# Patient Record
Sex: Female | Born: 1990 | Hispanic: No | Marital: Married | Smoking: Never smoker
Health system: Southern US, Community
[De-identification: ages and names within clinical notes are randomized; demographics above are authoritative.]

## PROBLEM LIST (undated history)

## (undated) ENCOUNTER — Emergency Department (HOSPITAL_COMMUNITY)

## (undated) DIAGNOSIS — Z0289 Encounter for other administrative examinations: Secondary | ICD-10-CM

## (undated) DIAGNOSIS — E663 Overweight: Secondary | ICD-10-CM

## (undated) HISTORY — DX: Encounter for other administrative examinations: Z02.89

## (undated) HISTORY — DX: Overweight: E66.3

---

## 2020-03-18 NOTE — Congregational Nurse Program (Signed)
  Dept: [phone]   Congregational Nurse Program Note  Date of Encounter: 03/18/2020  Past Medical History: No past medical history on file.  Encounter Details:   New arrival form [place] c/o right shoulder pain pain and abdominal pain.She reports a diagnosis of H.pylori in [place].She would also like to establish care with PCP. Appointment scheduled with [facility] Internal medicine for December 20th @2 :45PM  RN BSN PCCN  [facility] [phone]-office [phone]

## 2020-03-19 ENCOUNTER — Ambulatory Visit (HOSPITAL_COMMUNITY)
Admission: EM | Admit: 2020-03-19 | Discharge: 2020-03-19 | Disposition: A | Discharge status: Home / Self Care | Payer: Medicaid Other | Attending: Internal Medicine | Admitting: Internal Medicine

## 2020-03-19 ENCOUNTER — Encounter (HOSPITAL_COMMUNITY): Payer: Self-pay

## 2020-03-19 DIAGNOSIS — R350 Frequency of micturition: Secondary | ICD-10-CM

## 2020-03-19 DIAGNOSIS — R1030 Lower abdominal pain, unspecified: Secondary | ICD-10-CM

## 2020-03-19 DIAGNOSIS — K2900 Acute gastritis without bleeding: Secondary | ICD-10-CM | POA: Diagnosis not present

## 2020-03-19 DIAGNOSIS — R110 Nausea: Secondary | ICD-10-CM

## 2020-03-19 DIAGNOSIS — R3915 Urgency of urination: Secondary | ICD-10-CM | POA: Diagnosis not present

## 2020-03-19 DIAGNOSIS — M549 Dorsalgia, unspecified: Secondary | ICD-10-CM

## 2020-03-19 LAB — POCT URINALYSIS DIPSTICK, ED / UC
Bilirubin Urine: NEGATIVE
Glucose, UA: NEGATIVE mg/dL
Hgb urine dipstick: NEGATIVE
Ketones, ur: NEGATIVE mg/dL
Nitrite: NEGATIVE
Protein, ur: NEGATIVE mg/dL
Specific Gravity, Urine: 1.015 (ref 1.005–1.030)
Urobilinogen, UA: 0.2 mg/dL (ref 0.0–1.0)
pH: 5.5 (ref 5.0–8.0)

## 2020-03-19 MED ORDER — ACETAMINOPHEN 500 MG PO TABS: 500.0000 mg | ORAL_TABLET | Freq: Four times a day (QID) | ORAL | 0 refills | Status: DC | PRN

## 2020-03-19 MED ORDER — TIZANIDINE HCL 4 MG PO TABS: 4.0000 mg | ORAL_TABLET | Freq: Every day | ORAL | 0 refills | Status: DC

## 2020-03-19 MED ORDER — PANTOPRAZOLE SODIUM 20 MG PO TBEC: 20.0000 mg | DELAYED_RELEASE_TABLET | Freq: Every day | ORAL | 1 refills | Status: DC

## 2020-03-19 NOTE — ED Triage Notes (Signed)
Pt presents with lower back pain and abdominal pain X  5 days. Pt states she is experiencing dysuria, frequency, urgency X 2 days. Pt states she has been feeling nauseous.

## 2020-03-19 NOTE — ED Triage Notes (Signed)
Pt states she is having shoulder pain and stomach pain.

## 2020-03-20 NOTE — ED Provider Notes (Signed)
[facility]    CSN: [id number] Arrival date & time: 03/19/20  1221      History   Chief Complaint Chief Complaint  Patient presents with  . Abdominal Pain  . Back Pain    Mid and lower  . Nausea  . Urinary Urgency  . Urinary Frequency  . Shoulder Pain    HPI Katrina Brewer is a 29 y.o. female comes to the urgent care with complaints of mid to upper back pain of 5 days duration.  Symptoms started 5 days ago and has been persistent.  Patient describes the pain as throbbing.  She has tried ibuprofen for the pain.  Pain is aggravated by movement.  No heavy lifting or falls.  No trauma. No known relieving factors.  Patient also complains of epigastric abdominal pain.  Pain is worse when she eats.  No vomiting but she has had some nausea sensation.  Patient relocated here to the [place] from [place].Marland Kitchen   HPI  History reviewed. No pertinent past medical history.  There are no problems to display for this patient.   History reviewed. No pertinent surgical history.  OB History   No obstetric history on file.      Home Medications    Prior to Admission medications   Medication Sig Start Date End Date Taking? Authorizing Provider  acetaminophen (TYLENOL) 500 MG tablet Take 1 tablet (500 mg total) by mouth every 6 (six) hours as needed. 03/19/20   Merrilee Jansky, MD  pantoprazole (PROTONIX) 20 MG tablet Take 1 tablet (20 mg total) by mouth daily. 03/19/20   Obinna Ehresman, Britta Mccreedy, MD  tiZANidine (ZANAFLEX) 4 MG tablet Take 1 tablet (4 mg total) by mouth at bedtime. 03/19/20   Sebastyan Snodgrass, Britta Mccreedy, MD    Family History History reviewed. No pertinent family history.  Social History Social History   Tobacco Use  . Smoking status: Never Smoker  . Smokeless tobacco: Never Used     Allergies   Patient has no known allergies.   Review of Systems Review of Systems  Constitutional: Negative for fatigue.  HENT: Negative.   Gastrointestinal: Positive for  abdominal pain and nausea. Negative for diarrhea and vomiting.  Genitourinary: Positive for urgency. Negative for difficulty urinating, dysuria and frequency.  Musculoskeletal: Positive for back pain and myalgias.  Neurological: Negative for dizziness, numbness and headaches.     Physical Exam Triage Vital Signs ED Triage Vitals  Enc Vitals Group     BP 03/19/20 1407 102/68     Pulse Rate 03/19/20 1407 68     Resp 03/19/20 1407 18     Temp 03/19/20 1407 98 F (36.7 C)     Temp Source 03/19/20 1407 Oral     SpO2 03/19/20 1407 100 %     Weight --      Height --      Head Circumference --      Peak Flow --      Pain Score 03/19/20 1429 7     Pain Loc --      Pain Edu? --      Excl. in GC? --    No data found.  Updated Vital Signs BP 102/68 (BP Location: Left Arm)   Pulse 68   Temp 98 F (36.7 C) (Oral)   Resp 18   LMP 03/01/2020 (Exact Date)   SpO2 100%   Visual Acuity Right Eye Distance:   Left Eye Distance:   Bilateral Distance:  Right Eye Near:   Left Eye Near:    Bilateral Near:     Physical Exam Vitals reviewed.  Constitutional:      General: She is not in acute distress.    Appearance: She is well-developed. She is not ill-appearing, toxic-appearing or diaphoretic.  Cardiovascular:     Rate and Rhythm: Normal rate and regular rhythm.     Heart sounds: Normal heart sounds.  Pulmonary:     Effort: Pulmonary effort is normal.     Breath sounds: Normal breath sounds. No stridor. No wheezing or rhonchi.  Abdominal:     General: Abdomen is flat. There is no distension or abdominal bruit.     Palpations: Abdomen is soft. There is no shifting dullness, hepatomegaly or splenomegaly.     Tenderness: There is no abdominal tenderness.     Hernia: No hernia is present.  Neurological:     Mental Status: She is alert.      UC Treatments / Results  Labs (all labs ordered are listed, but only abnormal results are displayed) Labs Reviewed  POCT URINALYSIS  DIPSTICK, ED / UC - Abnormal; Notable for the following components:      Result Value   Leukocytes,Ua TRACE (*)    All other components within normal limits    EKG   Radiology No results found.  Procedures Procedures (including critical care time)  Medications Ordered in UC Medications - No data to display  Initial Impression / Assessment and Plan / UC Course  I have reviewed the triage vital signs and the nursing notes.  Pertinent labs & imaging results that were available during my care of the patient were reviewed by me and considered in my medical decision making (see chart for details).     1.  Musculoskeletal back pain: Tylenol extra strength as needed for pain Gentle range of motion exercises Zanaflex as needed for muscle spasms  2.  Epigastric abdominal pain likely acute gastritis-NSAID induced: Avoid NSAID use Use Tylenol as needed for pain Protonix 20 mg orally daily If patient symptoms are persistent she would need to be evaluated for H. pylori. Return precautions given Consultation was done using an interpreter Final Clinical Impressions(s) / UC Diagnoses   Final diagnoses:  Acute superficial gastritis without hemorrhage  Musculoskeletal back pain   Discharge Instructions   None    ED Prescriptions    Medication Sig Dispense Auth. Provider   pantoprazole (PROTONIX) 20 MG tablet Take 1 tablet (20 mg total) by mouth daily. 90 tablet Leasha Goldberger, Britta Mccreedy, MD   tiZANidine (ZANAFLEX) 4 MG tablet Take 1 tablet (4 mg total) by mouth at bedtime. 30 tablet Batoul Limes, Britta Mccreedy, MD   acetaminophen (TYLENOL) 500 MG tablet Take 1 tablet (500 mg total) by mouth every 6 (six) hours as needed. 30 tablet Prentice Sackrider, Britta Mccreedy, MD     PDMP not reviewed this encounter.   Merrilee Jansky, MD 03/20/20 [phone]

## 2020-03-24 ENCOUNTER — Encounter: Payer: Self-pay | Admitting: Internal Medicine

## 2020-04-29 NOTE — Congregational Nurse Program (Signed)
  Dept: [phone]   Congregational Nurse Program Note  Date of Encounter: 04/29/2020  Past Medical History: No past medical history on file.  Encounter Details:  CNP Questionnaire - 04/29/20 1158      Questionnaire   Do you give verbal consent to treat you today? Yes          Client came in today c/o tension head ache. She is requesting assistance getting over the counter medication for headache. Several options including tylenol mentioned to the patient. She would like to try tylenol. Assisted to get tylenol OTC from [facility]. Advised to seek medical attention incase the headache gets worse or is associated with other symptoms.  Arman Bogus RN BSn PCCN  [facility] [phone]-office

## 2020-05-13 ENCOUNTER — Other Ambulatory Visit: Payer: Self-pay

## 2020-05-13 ENCOUNTER — Other Ambulatory Visit: Payer: Self-pay | Admitting: Obstetrics and Gynecology

## 2020-05-13 ENCOUNTER — Ambulatory Visit
Admission: RE | Admit: 2020-05-13 | Discharge: 2020-05-13 | Disposition: A | Discharge status: Home / Self Care | Payer: Self-pay | Source: Ambulatory Visit | Attending: Obstetrics and Gynecology | Admitting: Obstetrics and Gynecology

## 2020-05-13 DIAGNOSIS — R7612 Nonspecific reaction to cell mediated immunity measurement of gamma interferon antigen response without active tuberculosis: Secondary | ICD-10-CM

## 2020-07-15 ENCOUNTER — Ambulatory Visit: Payer: No Typology Code available for payment source

## 2020-07-29 ENCOUNTER — Other Ambulatory Visit: Payer: Self-pay

## 2020-07-29 ENCOUNTER — Ambulatory Visit (INDEPENDENT_AMBULATORY_CARE_PROVIDER_SITE_OTHER): Payer: Medicaid Other | Admitting: Family Medicine

## 2020-07-29 VITALS — BP 121/65 | HR 86 | Ht 61.81 in | Wt 158.2 lb

## 2020-07-29 DIAGNOSIS — K089 Disorder of teeth and supporting structures, unspecified: Secondary | ICD-10-CM | POA: Insufficient documentation

## 2020-07-29 DIAGNOSIS — Z1159 Encounter for screening for other viral diseases: Secondary | ICD-10-CM | POA: Diagnosis not present

## 2020-07-29 DIAGNOSIS — H7291 Unspecified perforation of tympanic membrane, right ear: Secondary | ICD-10-CM | POA: Insufficient documentation

## 2020-07-29 DIAGNOSIS — R1013 Epigastric pain: Secondary | ICD-10-CM

## 2020-07-29 DIAGNOSIS — Z0289 Encounter for other administrative examinations: Secondary | ICD-10-CM | POA: Diagnosis not present

## 2020-07-29 DIAGNOSIS — Z114 Encounter for screening for human immunodeficiency virus [HIV]: Secondary | ICD-10-CM

## 2020-07-29 DIAGNOSIS — N979 Female infertility, unspecified: Secondary | ICD-10-CM | POA: Insufficient documentation

## 2020-07-29 DIAGNOSIS — R94120 Abnormal auditory function study: Secondary | ICD-10-CM | POA: Diagnosis not present

## 2020-07-29 DIAGNOSIS — Z227 Latent tuberculosis: Secondary | ICD-10-CM | POA: Insufficient documentation

## 2020-07-29 LAB — POCT URINALYSIS DIP (MANUAL ENTRY)
Bilirubin, UA: NEGATIVE
Blood, UA: NEGATIVE
Glucose, UA: NEGATIVE mg/dL
Ketones, POC UA: NEGATIVE mg/dL
Leukocytes, UA: NEGATIVE
Nitrite, UA: NEGATIVE
Protein Ur, POC: NEGATIVE mg/dL
Spec Grav, UA: 1.025 (ref 1.010–1.025)
Urobilinogen, UA: 0.2 E.U./dL
pH, UA: 5.5 (ref 5.0–8.0)

## 2020-07-29 LAB — POCT URINE PREGNANCY: Preg Test, Ur: NEGATIVE

## 2020-07-29 MED ORDER — ALBENDAZOLE 200 MG PO TABS: 400.0000 mg | ORAL_TABLET | Freq: Once | ORAL | 0 refills | Status: AC

## 2020-07-29 MED ORDER — FAMOTIDINE 20 MG PO TABS: 20.0000 mg | ORAL_TABLET | Freq: Two times a day (BID) | ORAL | 0 refills | Status: DC

## 2020-07-29 MED ORDER — IVERMECTIN 3 MG PO TABS: 200.0000 ug/kg | ORAL_TABLET | Freq: Every day | ORAL | 0 refills | Status: DC

## 2020-07-29 MED ORDER — ALBENDAZOLE 200 MG PO TABS: 400.0000 mg | ORAL_TABLET | Freq: Once | ORAL | 0 refills | Status: DC

## 2020-07-29 MED ORDER — IVERMECTIN 3 MG PO TABS: 200.0000 ug/kg | ORAL_TABLET | Freq: Every day | ORAL | 0 refills | Status: AC

## 2020-07-29 NOTE — Assessment & Plan Note (Addendum)
Refugee from [place], speaks Dari Arrived in August 2022 Labs obtained today Follow up 08/14/20 to review Empiric treatment with Ivermectin and Albendazole

## 2020-07-29 NOTE — Assessment & Plan Note (Signed)
IGRA positive on 12/12/19, XR normal 05/13/20.  Currently treated through [facility] Department INH 300mg and Vitamin B6 25mg started on 06/23/20.  

## 2020-07-29 NOTE — Assessment & Plan Note (Addendum)
G2P2. Desires fertility but has been unsuccessful. Plan to further discuss at follow up visit. Can obtain basic labs. Can consider referral to [facility] but unlikely to be covered by Medicaid.

## 2020-07-29 NOTE — Patient Instructions (Addendum)
It was wonderful to see you today.  Please bring ALL of your medications with you to every visit.   Today we talked about:  Please continue to take Tylenol for your headaches as needed.  Since you are practicing Ramadon, will have to wait on H. Pylori testing. In the mean time, please start Pepcid 20mg : take 1 tablet twice a day every day. You can stop the other medicine.   We are getting labs today. We will discuss this at your follow up visit on 08/14/20.  You have a hole in your ear drum. We are referring you to an Ear, Nose and Throat doctor. You are scheduled for May 20th at 12:50pm at   [facility]  [facility] [address] [place], [place] [phone]  Thank you for choosing [facility].   Please call [phone] with any questions about today's appointment.  Please be sure to schedule follow up at the front  desk before you leave today.   [phone], DO Orpah Cobb, MD  Family Medicine

## 2020-07-29 NOTE — Assessment & Plan Note (Addendum)
Perforation of right TM with abnormal hearing screen. Refer to ENT for further evaluation. Scheduled for 08/22/20 at 12:50pm

## 2020-07-29 NOTE — Assessment & Plan Note (Signed)
Will provide dental list at follow up visit

## 2020-07-29 NOTE — Assessment & Plan Note (Signed)
Chronic. Minimal improvement with PRN PPI Unable to perform H. pyori testing given Ramadon  Treat with Pepcid 20mg  BID for now, plan to test for H. Pylori at follow up visit

## 2020-07-29 NOTE — Progress Notes (Signed)
Patient Name: Katrina Brewer Date of Birth: Sep 14, 1990 Date of Visit: 07/29/20 PCP: Joana Reamer, DO  Chief Complaint: refugee intake examination   The patient's preferred language is Dari. An interpreter was used for the entire visit.  Interpreter Name or ID: Mahin Taremian  Born: [place] Refugee: [place] ([place]) to [place] x 2 months to [place]   Subjective: Katrina Brewer is a pleasant 30 y.o. presenting today for an initial refugee and immigrant clinic visit.    ROS: 1. Epigastric pain - worse with certain foods, has not been treated or tested for H. Pylori. Bloated and pain with certain foods. Sometimes worse with lying flat. Was given Protonix 20mg  and told to take it as needed. She denied any improvement. 2. Frontal and occipital headaches, chronic x 3 years, 2x/week: improves with sleep, mild improvement with tylenol/ibuprofen, No photophobia/phonophobia. Takes tylenol/ibuprofen 2x/week. Her eyes feel like they're burning sometimes. Denies weakness.  3. Fertility concerns: has been trying to get pregnant. Last two pregnancies were difficult to conceive 4. 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 Xray 05/13/20 normal. Currently on INH 300mg  and Vitamin B6 25mg , start date 06/23/20  PSH: C-section in ~10/10/2013, unsure why   FH: None  Allergies:  None  Current Medications:  INH 300mg  and Vitamin B6 25mg  Tylenol/Ibuprofen as needed for headache   Social History: Tobacco Use: None Alcohol Use: none  In the past two weeks, have you run out of food before you had money to purchase more? No  In the past two weeks, have you had difficulty with obtaining food for your family? 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 Dentition is poor throughout. Appears well hydrated. Right ear drum perforated Neck: Supple, no thyromegaly, no cervical lymphadenopathy  Cardiac: Regular rate and rhythm. Normal S1/S2. No murmurs, rubs, or gallops appreciated. Lungs: Clear bilaterally to ascultation.  Abdomen: Normoactive bowel sounds. No tenderness to deep or light palpation. No rebound or guarding. No hepato or Splenomegaly  Extremities: Warm, well perfused without edema. No tremors Skin: warm and dry, Psych: Pleasant and appropriate  MSK: normal tone, normal gait  Depression screen [facility] 2/9 07/29/2020  Decreased Interest 0  Down, Depressed, Hopeless 0  PHQ - 2 Score 0  Altered sleeping 0  Tired, decreased energy 0  Change in appetite 0  Feeling  bad or failure about yourself  0  Trouble concentrating 0  Moving slowly or fidgety/restless 0  Suicidal thoughts 0  PHQ-9 Score 0    Refugee health examination Refugee from [place], speaks Dari Arrived in August 2022 Labs obtained  today Follow up 08/14/20 to review Empiric treatment with Ivermectin and Albendazole  Poor dentition Will provide dental list at follow up visit  Epigastric pain Chronic. Minimal improvement with PRN PPI Unable to perform H. pyori testing given Ramadon  Treat with Pepcid 20mg  BID for now, plan to test for H. Pylori at follow up visit  Female fertility problem G2P2. Desires fertility but has been unsuccessful. Plan to further discuss at follow up visit. Can obtain basic labs. Can consider referral to Fertility center but unlikely to be covered by Medicaid.  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  Currently treated through Health Department INH 300mg  and Vitamin B6 25mg  started on 06/23/20.   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 Refer to ENT for further evaluation  I have personally updated the history tabs within Epic and included the refugee information in social documentation.   Designated signed with agency  Release of information signed for Health Department  Return to care in 1 month in [facility] with resident physician and PCP   Vaccines: Will need varicella, Hep A  06/25/20, DO [facility], PGY3 07/29/2020 3:58 PM

## 2020-07-30 ENCOUNTER — Other Ambulatory Visit: Payer: Self-pay | Admitting: Family Medicine

## 2020-07-30 DIAGNOSIS — R7989 Other specified abnormal findings of blood chemistry: Secondary | ICD-10-CM

## 2020-07-30 LAB — LIPID PANEL
Chol/HDL Ratio: 3.7 ratio (ref 0.0–4.4)
Cholesterol, Total: 167 mg/dL (ref 100–199)
HDL: 45 mg/dL (ref >39)
LDL Chol Calc (NIH): 110 mg/dL — ABNORMAL HIGH (ref 0–99)
Triglycerides: 64 mg/dL (ref 0–149)
VLDL Cholesterol Cal: 12 mg/dL (ref 5–40)

## 2020-07-30 LAB — HEPATITIS B SURFACE ANTIBODY, QUANTITATIVE: Hepatitis B Surf Ab Quant: <3.1 m[IU]/mL — ABNORMAL LOW (ref >9.9)

## 2020-07-30 LAB — COMPREHENSIVE METABOLIC PANEL
ALT: 11 IU/L (ref 0–32)
AST: 16 IU/L (ref 0–40)
Albumin/Globulin Ratio: 1.6 (ref 1.2–2.2)
Albumin: 4.4 g/dL (ref 3.9–5.0)
Alkaline Phosphatase: 66 IU/L (ref 44–121)
BUN/Creatinine Ratio: 15 (ref 9–23)
BUN: 8 mg/dL (ref 6–20)
Bilirubin Total: 0.3 mg/dL (ref 0.0–1.2)
CO2: 22 mmol/L (ref 20–29)
Calcium: 9 mg/dL (ref 8.7–10.2)
Chloride: 102 mmol/L (ref 96–106)
Creatinine, Ser: 0.53 mg/dL — ABNORMAL LOW (ref 0.57–1.00)
Globulin, Total: 2.7 g/dL (ref 1.5–4.5)
Glucose: 82 mg/dL (ref 65–99)
Potassium: 4.2 mmol/L (ref 3.5–5.2)
Sodium: 139 mmol/L (ref 134–144)
Total Protein: 7.1 g/dL (ref 6.0–8.5)
eGFR: 128 mL/min/{1.73_m2} (ref >59)

## 2020-07-30 LAB — CBC WITH DIFFERENTIAL/PLATELET
Basophils Absolute: 0 10*3/uL (ref 0.0–0.2)
Basos: 0 %
EOS (ABSOLUTE): 0.1 10*3/uL (ref 0.0–0.4)
Eos: 1 %
Hematocrit: 33.6 % — ABNORMAL LOW (ref 34.0–46.6)
Hemoglobin: 11.1 g/dL (ref 11.1–15.9)
Immature Grans (Abs): 0 10*3/uL (ref 0.0–0.1)
Immature Granulocytes: 0 %
Lymphocytes Absolute: 1.8 10*3/uL (ref 0.7–3.1)
Lymphs: 27 %
MCH: 26.3 pg — ABNORMAL LOW (ref 26.6–33.0)
MCHC: 33 g/dL (ref 31.5–35.7)
MCV: 80 fL (ref 79–97)
Monocytes Absolute: 0.4 10*3/uL (ref 0.1–0.9)
Monocytes: 5 %
Neutrophils Absolute: 4.4 10*3/uL (ref 1.4–7.0)
Neutrophils: 67 %
Platelets: 368 10*3/uL (ref 150–450)
RBC: 4.22 x10E6/uL (ref 3.77–5.28)
RDW: 13.1 % (ref 11.7–15.4)
WBC: 6.7 10*3/uL (ref 3.4–10.8)

## 2020-07-30 LAB — HCV AB W REFLEX TO QUANT PCR: HCV Ab: <0.1 s/co ratio (ref 0.0–0.9)

## 2020-07-30 LAB — HEPATITIS B CORE ANTIBODY, TOTAL: Hep B Core Total Ab: NEGATIVE

## 2020-07-30 LAB — HCV INTERPRETATION

## 2020-07-30 LAB — TSH: TSH: 0.149 u[IU]/mL — ABNORMAL LOW (ref 0.450–4.500)

## 2020-07-30 LAB — HEPATITIS B SURFACE ANTIGEN: Hepatitis B Surface Ag: NEGATIVE

## 2020-07-30 LAB — RPR: RPR Ser Ql: NONREACTIVE

## 2020-07-30 LAB — HIV ANTIBODY (ROUTINE TESTING W REFLEX): HIV Screen 4th Generation wRfx: NONREACTIVE

## 2020-07-31 LAB — T4, FREE: Free T4: 1.39 ng/dL (ref 0.82–1.77)

## 2020-07-31 LAB — SPECIMEN STATUS REPORT

## 2020-08-07 ENCOUNTER — Encounter: Payer: Self-pay | Admitting: Family Medicine

## 2020-08-07 DIAGNOSIS — R7612 Nonspecific reaction to cell mediated immunity measurement of gamma interferon antigen response without active tuberculosis: Secondary | ICD-10-CM | POA: Insufficient documentation

## 2020-08-10 NOTE — Progress Notes (Signed)
   Subjective:   Patient ID: Katrina Brewer    DOB: 10-Apr-1990, 30 y.o. female   MRN: [id number]  Katrina Brewer is a 30 y.o. female with a history of latent TB, poor dentition, perforation of right TM, epigastric pain, female fertility problem here for lab follow up  Acute Concerns: None  Epigastric Pain: only took two of the Pepcid previously prescribed. She notes her epigastric pain now has improved. Denies any current pain.   Abnormal TSH: Notes hair loss, some occasional weight loss, sometimes weight gain. Denies constipation/diarrhea, anxiety, difficulty sleeping.  Infertility/GYN concerns: Has concerns but given female interpreter has opted to hold off on discussion today for a female interpreter   HPI: Patient here today to review labs.   Review of Systems:  Per HPI.   Objective:   BP 118/75   Pulse 64   Wt 156 lb 12.8 oz (71.1 kg)   LMP 07/16/2020 (Exact Date)   SpO2 95%   BMI 28.85 kg/m  Vitals and nursing note reviewed.  General: pleasant young female, sitting comfortably in exam chair, well nourished, well developed, in no acute distress with non-toxic appearance Resp: breathing comfortably on room air, speaking in full sentences MSK:  gait normal Neuro: Alert and oriented, speech normal  Assessment & Plan:   Subclinical hyperthyroidism Discussed results with patient. Stable vitals. Will obtain follow up labs today including TSH, T3, T4. Follow up on 09/10/20 to review  Elevated LDL cholesterol level Mild. Recommended lifestyle modifications.   Epigastric pain Chronic. Currently asymptomatic with improvement in symptoms. Continue PRN Pepcid Consider H. Pylori testing if recurs.   Female fertility problem Patient was not comfortable discussing this with female interpreter. Opted to discuss at follow up visit.  Prolactin level today to evaluate.  Nonimmune to hepatitis B virus Has received 1 dose of Hep B 04/17/20 Dose #2 given 08/14/20 Dose #3 can be  given in 2 months (8 weeks after dose 2, at least 16 weeks from dose 1) Will discuss date of next vaccine at follow up visit.  Refugee health examination Unable to afford Ivermectin. New rx provided and taken to [facility].  Will require PA in order for insurance to cover medication otherwise $30 out of pocket. Will await forms to complete PA.  Orders Placed This Encounter  Procedures  . Hepatitis B vaccine adult IM  . TSH  . T3  . T4, free  . Prolactin   Meds ordered this encounter  Medications  . DISCONTD: ivermectin (STROMECTOL) 3 MG TABS tablet    Sig: Take 4 tablets (12 mg total) by mouth daily for 2 doses.    Dispense:  9 tablet    Refill:  0  . DISCONTD: albendazole (ALBENZA) 200 MG tablet    Sig: Take 2 tablets (400 mg total) by mouth once for 1 dose.    Dispense:  2 tablet    Refill:  0     Orpah Cobb, DO PGY-3, [facility] 08/15/2020 8:19 AM

## 2020-08-14 ENCOUNTER — Encounter: Payer: Self-pay | Admitting: Family Medicine

## 2020-08-14 ENCOUNTER — Other Ambulatory Visit: Payer: Self-pay

## 2020-08-14 ENCOUNTER — Other Ambulatory Visit (HOSPITAL_COMMUNITY): Payer: Self-pay

## 2020-08-14 ENCOUNTER — Ambulatory Visit (INDEPENDENT_AMBULATORY_CARE_PROVIDER_SITE_OTHER): Payer: Medicaid Other | Admitting: Family Medicine

## 2020-08-14 VITALS — BP 118/75 | HR 64 | Wt 156.8 lb

## 2020-08-14 DIAGNOSIS — Z23 Encounter for immunization: Secondary | ICD-10-CM | POA: Diagnosis not present

## 2020-08-14 DIAGNOSIS — E0590 Thyrotoxicosis, unspecified without thyrotoxic crisis or storm: Secondary | ICD-10-CM | POA: Diagnosis not present

## 2020-08-14 DIAGNOSIS — E7800 Pure hypercholesterolemia, unspecified: Secondary | ICD-10-CM | POA: Diagnosis not present

## 2020-08-14 DIAGNOSIS — N979 Female infertility, unspecified: Secondary | ICD-10-CM | POA: Diagnosis not present

## 2020-08-14 DIAGNOSIS — R1013 Epigastric pain: Secondary | ICD-10-CM | POA: Diagnosis not present

## 2020-08-14 DIAGNOSIS — Z789 Other specified health status: Secondary | ICD-10-CM | POA: Diagnosis not present

## 2020-08-14 DIAGNOSIS — Z0289 Encounter for other administrative examinations: Secondary | ICD-10-CM | POA: Diagnosis present

## 2020-08-14 MED ORDER — ALBENDAZOLE 200 MG PO TABS: 400.0000 mg | ORAL_TABLET | Freq: Once | ORAL | 0 refills | Status: DC

## 2020-08-14 MED ORDER — IVERMECTIN 3 MG PO TABS
12.5000 mg | ORAL_TABLET | Freq: Every day | ORAL | 0 refills | Status: DC
  Filled 2020-08-14: qty 9, 2d supply, fill #0
  Filled 2020-08-14: qty 9, 29d supply, fill #0

## 2020-08-14 MED ORDER — IVERMECTIN 3 MG PO TABS
13.0000 mg | ORAL_TABLET | Freq: Every day | ORAL | 0 refills | Status: AC
  Filled 2020-08-14 (×2): qty 9, 2d supply, fill #0

## 2020-08-14 NOTE — Patient Instructions (Addendum)
I am getting labs today to review your thyroid I will see you on 09/10/20 to review  You also got your Hepatitis B vaccine today. The next vaccine will need to be given in 2 months.   We will plan to discuss your gynecologic concerns at next visit.  Please go to [facility] at  [address] [place], [place], [place] [address]  to obtain the Ivermectin   Take care, Dr. Mauri Reading

## 2020-08-15 DIAGNOSIS — E0590 Thyrotoxicosis, unspecified without thyrotoxic crisis or storm: Secondary | ICD-10-CM | POA: Insufficient documentation

## 2020-08-15 DIAGNOSIS — E7800 Pure hypercholesterolemia, unspecified: Secondary | ICD-10-CM | POA: Insufficient documentation

## 2020-08-15 DIAGNOSIS — Z789 Other specified health status: Secondary | ICD-10-CM | POA: Insufficient documentation

## 2020-08-15 LAB — PROLACTIN: Prolactin: 12.6 ng/mL (ref 4.8–23.3)

## 2020-08-15 LAB — T3: T3, Total: 136 ng/dL (ref 71–180)

## 2020-08-15 LAB — T4, FREE: Free T4: 1.31 ng/dL (ref 0.82–1.77)

## 2020-08-15 LAB — TSH: TSH: 0.158 u[IU]/mL — ABNORMAL LOW (ref 0.450–4.500)

## 2020-08-15 NOTE — Assessment & Plan Note (Addendum)
Discussed results with patient. Stable vitals. Will obtain follow up labs today including TSH, T3, T4. Follow up on 09/10/20 to review

## 2020-08-15 NOTE — Assessment & Plan Note (Signed)
Unable to afford Ivermectin. New rx provided and taken to [facility].  Will require PA in order for insurance to cover medication otherwise $30 out of pocket. Will await forms to complete PA.

## 2020-08-15 NOTE — Assessment & Plan Note (Addendum)
Chronic. Currently asymptomatic with improvement in symptoms. Continue PRN Pepcid Consider H. Pylori testing if recurs.

## 2020-08-15 NOTE — Assessment & Plan Note (Signed)
Mild. Recommended lifestyle modifications.

## 2020-08-15 NOTE — Assessment & Plan Note (Signed)
Patient was not comfortable discussing this with female interpreter. Opted to discuss at follow up visit.  Prolactin level today to evaluate.

## 2020-08-15 NOTE — Assessment & Plan Note (Signed)
Has received 1 dose of Hep B 04/17/20 Dose #2 given 08/14/20 Dose #3 can be given in 2 months (8 weeks after dose 2, at least 16 weeks from dose 1) Will discuss date of next vaccine at follow up visit.

## 2020-09-02 ENCOUNTER — Other Ambulatory Visit: Payer: Self-pay

## 2020-09-02 DIAGNOSIS — R1013 Epigastric pain: Secondary | ICD-10-CM

## 2020-09-02 MED ORDER — FAMOTIDINE 20 MG PO TABS: 20.0000 mg | ORAL_TABLET | Freq: Two times a day (BID) | ORAL | 3 refills | Status: DC

## 2020-09-02 NOTE — Addendum Note (Signed)
Addended by: Joana Reamer on: 09/02/2020 03:33 PM   Modules accepted: Orders

## 2020-09-05 ENCOUNTER — Other Ambulatory Visit (HOSPITAL_COMMUNITY): Payer: Self-pay

## 2020-09-07 NOTE — Progress Notes (Signed)
   Subjective:   Patient ID: Katrina Brewer    DOB: May 19, 1990, 30 y.o. female   MRN: [id number]  Katrina Brewer is a 30 y.o. female with a history of refugee, latent TB, poor dentition, subclinical hyperthyroidism, perforated right TM, elevated LDL, epigastric pain, non Hep B immune, infertility problem here for gyn concerns  HPI: Patient presents today for yellow vaginal discharge, itching, burning x 2-3 weeks Has history of this in [place] and treated for this. She had this once when she moved here and treated with "one tablet". She has infrequent periods, usually around every 50 days. Denies dysuria or other urinary symptoms. Pain with intercourse as well.  Denies any fevers or chills. Denies pelvic pain. Last menstrual period 09/02/20-6/6/222  Review of Systems:  Per HPI.   Objective:   BP 118/60   Pulse 98   Ht 5' 2.5" (1.588 m)   Wt 160 lb 9.6 oz (72.8 kg)   LMP 09/02/2020   SpO2 99%   BMI 28.91 kg/m  Vitals and nursing note reviewed.  General: pleasant young female, sitting comfortably on exam bed, well nourished, well developed, in no acute distress with non-toxic appearance Resp: breathing comfortably on room air, speaking in full sentences Skin: warm, dry MSK: gait normal Neuro: Alert and oriented, speech normal Pelvic exam: normal external genitalia, vulva, vagina, cervix, uterus and adnexa, VULVA: normal appearing vulva with no masses, tenderness or lesions, VAGINA: normal appearing vagina with normal color and discharge, no lesions, CERVIX: friable, erythematous, ectropion, abnormally shaped with extra area of cervix noted on 6 o'clock, cervical discharge present - creamy grey, UTERUS: uterus is normal size, shape, consistency but tender to palpation, ADNEXA: normal adnexa in size, nontender and no masses, exam chaperoned by Gilberto Better. (+) CVA tenderness without chandelier sign  Assessment & Plan:   Cervicitis Concern for cervisitis given duration of  symptoms and physical exam. Fortunately no systemic symptoms.  Will empirically treat with ceftriaxone 500 mg IM x1 and doxycycline twice daily x7 days. Wet prep negative. GC/Cl pending. Follow-up next week to ensure improvement.  She also wants to discuss infertility concerns which we will attempt to discuss at that time.  Health maintenance: -Pap smear completed today  Orders Placed This Encounter  Procedures   POCT Wet Prep [facility])   Meds ordered this encounter  Medications   DISCONTD: cefTRIAXone (ROCEPHIN) injection 500 mg   DISCONTD: doxycycline (VIBRA-TABS) 100 MG tablet    Sig: Take 1 tablet (100 mg total) by mouth 2 (two) times daily for 7 days.    Dispense:  14 tablet    Refill:  0   doxycycline (VIBRA-TABS) 100 MG tablet    Sig: Take 1 tablet (100 mg total) by mouth 2 (two) times daily for 7 days.    Dispense:  14 tablet    Refill:  0   cefTRIAXone (ROCEPHIN) injection 250 mg   cefTRIAXone (ROCEPHIN) injection 250 mg     Orpah Cobb, DO PGY-3, [facility] 09/11/2020 8:33 AM

## 2020-09-10 ENCOUNTER — Ambulatory Visit (INDEPENDENT_AMBULATORY_CARE_PROVIDER_SITE_OTHER): Payer: Medicaid Other | Admitting: Family Medicine

## 2020-09-10 ENCOUNTER — Encounter: Payer: Self-pay | Admitting: Family Medicine

## 2020-09-10 ENCOUNTER — Other Ambulatory Visit (HOSPITAL_COMMUNITY)
Admission: RE | Admit: 2020-09-10 | Discharge: 2020-09-10 | Disposition: A | Discharge status: Home / Self Care | Payer: Medicaid Other | Source: Ambulatory Visit | Attending: Family Medicine | Admitting: Family Medicine

## 2020-09-10 ENCOUNTER — Other Ambulatory Visit: Payer: Self-pay

## 2020-09-10 VITALS — BP 118/60 | HR 98 | Ht 62.5 in | Wt 160.6 lb

## 2020-09-10 DIAGNOSIS — Z124 Encounter for screening for malignant neoplasm of cervix: Secondary | ICD-10-CM | POA: Diagnosis not present

## 2020-09-10 DIAGNOSIS — N72 Inflammatory disease of cervix uteri: Secondary | ICD-10-CM | POA: Diagnosis not present

## 2020-09-10 DIAGNOSIS — N898 Other specified noninflammatory disorders of vagina: Secondary | ICD-10-CM | POA: Diagnosis present

## 2020-09-10 LAB — POCT WET PREP (WET MOUNT)
Clue Cells Wet Prep Whiff POC: NEGATIVE
Trichomonas Wet Prep HPF POC: ABSENT

## 2020-09-10 MED ORDER — CEFTRIAXONE SODIUM 250 MG IJ SOLR
250.0000 mg | Freq: Once | INTRAMUSCULAR | Status: AC
  Administered 2020-09-10: 250 mg via INTRAMUSCULAR

## 2020-09-10 MED ORDER — DOXYCYCLINE HYCLATE 100 MG PO TABS: 100.0000 mg | ORAL_TABLET | Freq: Two times a day (BID) | ORAL | 0 refills | Status: AC

## 2020-09-10 MED ORDER — CEFTRIAXONE SODIUM 500 MG IJ SOLR: 500.0000 mg | Freq: Once | INTRAMUSCULAR | Status: DC

## 2020-09-10 MED ORDER — DOXYCYCLINE HYCLATE 100 MG PO TABS: 100.0000 mg | ORAL_TABLET | Freq: Two times a day (BID) | ORAL | 0 refills | Status: DC

## 2020-09-10 NOTE — Patient Instructions (Signed)
Today I obtained your Pap smear and testing to look for infection. Due to your symptoms I did go ahead and start antibiotics which included an injection and oral antibiotics that you will take twice a day for 7 days.  I would like you to follow-up with me next week to make sure things are improving well.  We can also discuss your infertility at that time. Please write a log of all of your last menstrual periods over the last year and how often you are having sexual intercourse.  In the meantime  I will call you if anything else returns abnormal. If your symptoms are worsening or you develop any fevers, chills, worsening pelvic pain please go to the emergency department for further evaluation.

## 2020-09-11 DIAGNOSIS — N72 Inflammatory disease of cervix uteri: Secondary | ICD-10-CM | POA: Insufficient documentation

## 2020-09-11 NOTE — Assessment & Plan Note (Signed)
Concern for cervisitis given duration of symptoms and physical exam. Fortunately no systemic symptoms.  Will empirically treat with ceftriaxone 500 mg IM x1 and doxycycline twice daily x7 days. Wet prep negative. GC/Cl pending. Follow-up next week to ensure improvement.  She also wants to discuss infertility concerns which we will attempt to discuss at that time.

## 2020-09-12 ENCOUNTER — Encounter: Payer: Self-pay | Admitting: Family Medicine

## 2020-09-12 LAB — CYTOLOGY - PAP
Chlamydia: NEGATIVE
Comment: NEGATIVE
Comment: NEGATIVE
Comment: NEGATIVE
Comment: NORMAL
Diagnosis: NEGATIVE
High risk HPV: NEGATIVE
Neisseria Gonorrhea: NEGATIVE
Trichomonas: NEGATIVE

## 2020-09-18 ENCOUNTER — Ambulatory Visit (INDEPENDENT_AMBULATORY_CARE_PROVIDER_SITE_OTHER): Payer: Medicaid Other | Admitting: Family Medicine

## 2020-09-18 ENCOUNTER — Encounter: Payer: Self-pay | Admitting: Family Medicine

## 2020-09-18 ENCOUNTER — Other Ambulatory Visit: Payer: Self-pay

## 2020-09-18 VITALS — BP 133/90 | HR 104 | Wt 160.6 lb

## 2020-09-18 DIAGNOSIS — N72 Inflammatory disease of cervix uteri: Secondary | ICD-10-CM

## 2020-09-18 DIAGNOSIS — R519 Headache, unspecified: Secondary | ICD-10-CM | POA: Diagnosis not present

## 2020-09-18 DIAGNOSIS — R1013 Epigastric pain: Secondary | ICD-10-CM

## 2020-09-18 DIAGNOSIS — G8929 Other chronic pain: Secondary | ICD-10-CM

## 2020-09-18 NOTE — Assessment & Plan Note (Signed)
Symptoms have resolved. NO further work up at this time.

## 2020-09-18 NOTE — Progress Notes (Signed)
Subjective:   Patient ID: Katrina Brewer    DOB: 1990-08-01, 30 y.o. female   MRN: [id number]  Katrina Brewer is a 30 y.o. female with a history of latent TB, poor dentition, subclinical hyperthyroidism, perforation of right tympanic membrane, refugee, female fertility problem, elevated LDL here for follow-up  Cervicitis: much improved since getting antibiotics.  Headaches: chronic. Was told by her home country that it was "due to her stomach". However she notes that her stomach pain has resolved and her headaches continue. She notes that when she is at school and try to read she gets a headache. She denies blurry vision or difficulty reading. Notes that the headaches resolve when she gets home and takes a nap. Head movement/neck ROM doesn't change the head. Denies photophobia/phonophobia however later notes that sun light makes it worse. Notes pain wraps around head. Endorses nausea and eye pressure. Denies fevers, head trauma. Denies family history of headaches. Treatment: sometimes ibuprofen which does not help. She notes a lot of stress on herself to learn which is difficult for her. This week she notes a headache x 3 days. Sleep does improve her headache some.   Review of Systems:  Per HPI.   Objective:   BP 133/90   Pulse (!) 104   Wt 160 lb 9.6 oz (72.8 kg)   LMP 09/02/2020   SpO2 100%   BMI 28.91 kg/m  Vitals and nursing note reviewed.  General: pleasant young female, sitting comfortably in exam chair, well nourished, well developed, in no acute distress with non-toxic appearance HEENT: normocephalic, atraumatic, moist mucous membranes, oropharynx clear without erythema or exudate, TM normal bilaterally, normal palate rise, PERRL, EOMI, no pain to palpation of scalp or forehead or tender points CV: regular rate and rhythm without murmurs, rubs, or gallops, no lower extremity edema, 2+ radial and pedal pulses bilaterally Lungs: clear to auscultation bilaterally with normal work  of breathing on room air, speaking in full sentences Skin: warm, dry Extremities: warm and well perfused, normal tone MSK: strength intact, gait normal Neuro: Alert and oriented, speech normal, cranial nerves grossly intact  Depression screen [facility] 2/9 09/18/2020 09/10/2020 08/14/2020  Decreased Interest 0 0 0  Down, Depressed, Hopeless 0 0 0  PHQ - 2 Score 0 0 0  Altered sleeping 0 0 0  Tired, decreased energy 0 0 0  Change in appetite 0 0 0  Feeling bad or failure about yourself  0 0 0  Trouble concentrating 0 0 0  Moving slowly or fidgety/restless 0 0 0  Suicidal thoughts 0 0 0  PHQ-9 Score 0 0 0  Difficult doing work/chores - Not difficult at all -     Assessment & Plan:   Cervicitis Symptoms have resolved. NO further work up at this time.  Epigastric pain Resolved. NO further work up at this time. Continue Pepcid PRN.  Chronic nonintractable headache Chronic.  Described as frontal headache that wraps around entire scalp.  Minimal improvement with ibuprofen.  No infectious symptoms or head trauma. Neurologic exam normal.  Overall appears most consistent with tension type headache but does have some migrainous like symptoms.  She denies vision abnormalities thus unlikely contributing.  Could be somatic in nature given various vague complaints.  At this time we will treat for both tension and migraine like headaches.  We will start with as needed Excedrin and daily magnesium 400 mg daily.  Recommended headache diary and adequate hydration.  Follow-up in 1 week to evaluate for  improvement.  Could consider as needed sumatriptan in place of Excedrin.  If these do not help control could consider daily medication.  Although PHQ-9 score is 0 may need to further evaluate depression as well, as this may be contributing.  No orders of the defined types were placed in this encounter.  No orders of the defined types were placed in this encounter.  Needs female interpreter for 09/25/20 at  10:50am. Request sent to [facility] Interpreter team email.    Orpah Cobb, DO PGY-3, [facility] 09/18/2020 6:41 PM

## 2020-09-18 NOTE — Assessment & Plan Note (Signed)
Resolved. NO further work up at this time. Continue Pepcid PRN.

## 2020-09-18 NOTE — Assessment & Plan Note (Addendum)
Chronic.  Described as frontal headache that wraps around entire scalp.  Minimal improvement with ibuprofen.  No infectious symptoms or head trauma. Neurologic exam normal.  Overall appears most consistent with tension type headache but does have some migrainous like symptoms.  She denies vision abnormalities thus unlikely contributing.  Could be somatic in nature given various vague complaints.  At this time we will treat for both tension and migraine like headaches.  We will start with as needed Excedrin and daily magnesium 400 mg daily.  Recommended headache diary and adequate hydration.  Follow-up in 1 week to evaluate for improvement.  Could consider as needed sumatriptan in place of Excedrin.  If these do not help control could consider daily medication.  Although PHQ-9 score is 0 may need to further evaluate depression as well, as this may be contributing.

## 2020-09-18 NOTE — Patient Instructions (Signed)
Take this medicine EVERY DAY

## 2020-09-23 NOTE — Progress Notes (Signed)
Subjective:   Patient ID: Katrina Brewer    DOB: [birth year], 30 y.o. female   MRN: [id number]  Katrina Brewer is a 30 y.o. female with a history of latent TB, poor dentition, subclinical hyperthyroidism, perforation of R TM, refugee, nonimmmune Hep B, female fertility problem, elevated LDL, chronic headache here for headache follow up  Headache: Chronic. Appears to be mixed type between tension and migraine. No red flag symptoms on history or exam. She was started on Excedrin PRN and Magnesium 400mg  QD. She was recommended to perform headache diary and adequate hydration. Today she notes she had a headache for 3 days following our appointment. She has been taking the magnesium daily. She has not taken the Excedrin. She does feel like they are a little better. They are not as severe when she does get them.   Vaginal Irritation: Notes the last 5 days she has had a lot of irritation and itching. A lot creamy discharge. Some pelvic pain. LMP 09/03/20-09/08/20 She notes that she gets this often. Denies douching or other cleaning products. Denies condoms or lubricants.  She endorses discomfort when she has intercourse.   Only sexually active with husband.  Review of Systems:  Per HPI.   Objective:   BP 100/62   Pulse 87   Ht 5' 2.5" (1.588 m)   Wt 161 lb (73 kg)   LMP 09/02/2020   SpO2 98%   BMI 28.98 kg/m  Vitals and nursing note reviewed.  General: pleasant young female, sitting comfortably in exam chair, well nourished, well developed, in no acute distress with non-toxic appearance Resp: breathing comfortably on room air, speaking in full sentences Skin: warm, dry Extremities: warm and well perfused, normal tone MSK: gait normal Neuro: Alert and oriented, speech normal Pelvic exam: VULVA: normal appearing vulva with no masses, tenderness or lesions, VAGINA: vaginal discharge - white and scant, nontender to bimanual exam, CERVIX: normal appearing cervix without discharge or lesions,  cervical discharge present - creamy and milky, abnormally shaped cervix with what appears to have been a prior tear of cervix and now has accessory flap at 5 o'clock region, she has significant tenderness to cervical and uterine manipulation Exam chaperoned by 09/04/2020.   Assessment & Plan:   Chronic nonintractable headache Chronic, slightly improved with Magnesium. Has not used Excedrin. Recommended continuing current plan and follow up in 1 month to reevaluate. If she continues to have intense headaches then consider Sumatriptan PRN. If this doesn't help control then we can get her on a controller migraine medicine. After this I would consider referral to neurology.  Abnormal appearance of cervix Chronic. She describes chronic pelvic discomfort and dyspareunia.  It is difficult to describe her cervical appearance but there is obvious abnormalities. She has significant tenderness to manipulation of cervix and uterus. She has been recently empirically treated for cervicitis given the level of pain but all testing was negative. She is only sexually active with her husband. She has history of c-section and subsequent difficult vaginal delivery that she notes required "significant repairing". I wonder if chronic scar tissue from these procedures could be causing her the dyspareunia she is experiencing. Given chronicity of her discomfort and her surgical history, I felt a referral to Gyn for there input was needed at this time. She was amendable to this plan.   Vaginal irritation Acute. S/p recent antibiotics. Wet prep positive for yeast. Will treat with Fluconazole x 2.  Orders Placed This Encounter  Procedures  Ambulatory referral to Gynecology    Referral Priority:   Routine    Referral Type:   Consultation    Referral Reason:   Specialty Services Required    Requested Specialty:   Gynecology    Number of Visits Requested:   1   POCT Wet Prep [facility])    Meds ordered this  encounter  Medications   DISCONTD: fluconazole (DIFLUCAN) 150 MG tablet    Sig: Take 1 tablet now, then 1 tablet in 72 hours    Dispense:  2 tablet    Refill:  0   fluconazole (DIFLUCAN) 150 MG tablet    Sig: Take 1 tablet now, then 1 tablet in 72 hours    Dispense:  2 tablet    Refill:  0     Orpah Cobb, DO PGY-3, [facility] 09/25/2020 12:20 PM

## 2020-09-25 ENCOUNTER — Ambulatory Visit (INDEPENDENT_AMBULATORY_CARE_PROVIDER_SITE_OTHER): Payer: Medicaid Other | Admitting: Family Medicine

## 2020-09-25 ENCOUNTER — Other Ambulatory Visit: Payer: Self-pay

## 2020-09-25 ENCOUNTER — Encounter: Payer: Self-pay | Admitting: Family Medicine

## 2020-09-25 VITALS — BP 100/62 | HR 87 | Ht 62.5 in | Wt 161.0 lb

## 2020-09-25 DIAGNOSIS — N898 Other specified noninflammatory disorders of vagina: Secondary | ICD-10-CM | POA: Insufficient documentation

## 2020-09-25 DIAGNOSIS — R519 Headache, unspecified: Secondary | ICD-10-CM | POA: Diagnosis not present

## 2020-09-25 DIAGNOSIS — B3731 Acute candidiasis of vulva and vagina: Secondary | ICD-10-CM

## 2020-09-25 DIAGNOSIS — N889 Noninflammatory disorder of cervix uteri, unspecified: Secondary | ICD-10-CM | POA: Diagnosis not present

## 2020-09-25 DIAGNOSIS — G8929 Other chronic pain: Secondary | ICD-10-CM

## 2020-09-25 DIAGNOSIS — B373 Candidiasis of vulva and vagina: Secondary | ICD-10-CM | POA: Diagnosis not present

## 2020-09-25 LAB — POCT WET PREP (WET MOUNT)
Clue Cells Wet Prep Whiff POC: NEGATIVE
Trichomonas Wet Prep HPF POC: ABSENT

## 2020-09-25 MED ORDER — FLUCONAZOLE 150 MG PO TABS: ORAL_TABLET | ORAL | 0 refills | Status: DC

## 2020-09-25 NOTE — Assessment & Plan Note (Signed)
Acute. S/p recent antibiotics. Wet prep positive for yeast. Will treat with Fluconazole x 2.

## 2020-09-25 NOTE — Addendum Note (Signed)
Addended by: Joana Reamer on: 09/25/2020 01:18 PM   Modules accepted: Orders

## 2020-09-25 NOTE — Assessment & Plan Note (Addendum)
Chronic. She describes chronic pelvic discomfort and dyspareunia.  It is difficult to describe her cervical appearance but there is obvious abnormalities. She has significant tenderness to manipulation of cervix and uterus. She has been recently empirically treated for cervicitis given the level of pain but all testing was negative. She is only sexually active with her husband. She has history of c-section and subsequent difficult vaginal delivery that she notes required "significant repairing". I wonder if chronic scar tissue from these procedures could be causing her the dyspareunia she is experiencing. Given chronicity of her discomfort and her surgical history, I felt a referral to Gyn for there input was needed at this time. She was amendable to this plan.

## 2020-09-25 NOTE — Assessment & Plan Note (Signed)
Chronic, slightly improved with Magnesium. Has not used Excedrin. Recommended continuing current plan and follow up in 1 month to reevaluate. If she continues to have intense headaches then consider Sumatriptan PRN. If this doesn't help control then we can get her on a controller migraine medicine. After this I would consider referral to neurology.

## 2020-12-01 ENCOUNTER — Encounter: Payer: Self-pay | Admitting: Family Medicine

## 2020-12-01 ENCOUNTER — Other Ambulatory Visit: Payer: Self-pay

## 2020-12-01 ENCOUNTER — Ambulatory Visit (INDEPENDENT_AMBULATORY_CARE_PROVIDER_SITE_OTHER): Payer: Medicaid Other | Admitting: Family Medicine

## 2020-12-01 VITALS — BP 127/74 | HR 79 | Ht 63.0 in | Wt 164.0 lb

## 2020-12-01 DIAGNOSIS — N979 Female infertility, unspecified: Secondary | ICD-10-CM

## 2020-12-01 DIAGNOSIS — H9190 Unspecified hearing loss, unspecified ear: Secondary | ICD-10-CM | POA: Diagnosis not present

## 2020-12-01 MED ORDER — PRENATAL MULTIVITAMIN CH: 1.0000 | ORAL_TABLET | Freq: Every day | ORAL | 11 refills | Status: DC

## 2020-12-01 MED ORDER — PRENATAL VITAMIN AND MINERAL 28-0.8 MG PO TABS: 1.0000 | ORAL_TABLET | Freq: Every day | ORAL | 11 refills | Status: DC

## 2020-12-01 NOTE — Progress Notes (Signed)
    A [facility] in-person interpreter was used for this encounter: Name: Mahin Taremian  SUBJECTIVE:   CHIEF COMPLAINT / HPI:   Chief Complaint  Patient presents with   Infertility     Katrina Brewer is a 30 y.o. female here to discuss fertility. Two to three times a week. Has not been taking birth control. Patient's last menstrual period was 11/11/2020. Pt and her husband have been trying to get pregnant for the past 4-5 months. Told in [place] that she had "weak eggs". She has two children (68 yo boy, 74 yo boy) and had a hard time getting pregnant with her first child.   Pt unsure when she first started menstruating believes around 12-13. Periods come every month and last about 3-5 days. Has 3 days of heavy bleeding that tapers off.      PERTINENT  PMH / PSH: reviewed and updated as appropriate   OBJECTIVE:   BP 127/74   Pulse 79   Ht 5\' 3"  (1.6 m)   Wt 164 lb (74.4 kg)   LMP 11/11/2020   BMI 29.05 kg/m    GEN: pleasant well appearing female, in no acute distress  CV: regular rate and rhythm RESP: no increased work of breathing, clear to ascultation bilaterally  ABD: Bowel sounds present. Soft, non-tender, non-distended.  MSK: no edema, or cyanosis noted SKIN: warm, dry   ASSESSMENT/PLAN:   Female fertility problem Pt is a 30 yo G52P2002 female with history of difficulty getting pregnant in G1P80. Pt has two girls and would like an additional child, preferably a female. Has been intercourse regularly (several times a week) with her husband for the past 4 months. She is having regular periods. PAP in June 2022 was normal. Encouraged healthy eating habits and exercise to maintain a healthy weight. Discussed methods of fertility tracking.  Pt declined basal temperature monitoring. Discussed calendar method in detail. Handout provided. Advised to start taking prenatal vitamins. Given hx of fertility issues in the past it is reasonable for pt to see REI soon.  - Referral  placed to reproductive endocrinology       July 2022, DO PGY-3, [facility] 12/01/2020

## 2020-12-01 NOTE — Patient Instructions (Addendum)
??? ????? ??? ??? ???? ?????? ???? ??? ?????? ???? ????? ???? ???? ????? ???. ??? ??? ????? ???? ??? ?? ??? ???? ?????? ?????? ????. ???? ??????? ?? ??? ???:   1. ?? ??? 6 ???? ???? ???? ? ????? ???? ??? ?????? ? ??? ?? ???? ?????? ?? ??????? ????. ??? ?? ???? ?????? ?? ??? ??? ?????? ???? ?? ??? ??? ?????? ???? ???. 2. ?? ??? ??????? ???? ????? ???? ?????? ???? ????? ??????? ????. ?? ??? ??? ?? ????? ???? ??????? ????. ???? ??? ???? ????? ????? ?? ??????? ????? ?????? ?? ????? ?? ??? ???? ???? ????? ?????. o ???? ????? ?????? 12 ?? 14 ??? ??? ?? ???? ???? ?????? ???? ????? ?? ????. o ??????? ???? ??????? (??? ????) ?? ?????? ??? ?? ????? ???? ?????? ???? ??? ????? ???? ????? ????. ?? ??? ???? ??? ???? ??? ????? ?? ??????.  ??? ?????? ????????? ??? ??? ?? ???? ??????? ??? ?????? ???? ?? ??? ???? ??? ?????? ????? ???. ?? ???? ??? ???????? ?? ????? ????????? ???? ??????? ???? ?????? ???? ?? ??? ???? ?????? ????? ??. ??? ?????? ??? ???? ??? 26 ?? 32 ??? ??? ????? ??? ?????? 8 ?? 19 ??????? ?????? ?? ?????. ???? ??????? ?? ???????? ???? ?? ??? ??? ?? ????? ????? ???? ??????? ???? ?? ?? ??? ??? ??????? ?? ??????? ??????? ????. ??? ??? ??? ???? ??? ?? ??? ???? ??? 26 ?? 32 ??? ??? ???? ?????? ??? ?? ????.  Consider:   The calendar method This method involves tracking menstrual cycles to determine when ovulation occurs. This method is helpful when the menstrual cycle varies in length. To use this method: For 6 months, record when menstrual periods start and end and the length of each menstrual cycle. The length of a menstrual cycle is from day 1 of the present menstrual period to day 1 of the next menstrual period. Use this information to determine when you will likely ovulate. Avoid sex during that time. You may need help from your health care provider to determine which days you are most likely to get pregnant. Ovulation usually happens 12-14 days before the start of the next menstrual period. Light  vaginal bleeding (spotting) or abdominal cramps during the middle of a menstrual cycle may be signs of ovulation. However, not all women have these symptoms.   The standard days method This method is based on studies of women's hormone levels throughout normal menstrual cycles. Based on these studies, a standard rule was developed to predict when women are most fertile during their menstrual cycle. According to the rule, if your cycle is 26-32 days long, you are most fertile between days 8 and 19. To prevent pregnancy, you should avoid having sex during this time, or use a barrier method of birth control. This method works best if your cycle isregularly between 26-32 days long.

## 2020-12-05 NOTE — Assessment & Plan Note (Addendum)
Pt is a 30 yo G15P2002 female with history of difficulty getting pregnant in [place]. Pt has two girls and would like an additional child, preferably a female. Has been intercourse regularly (several times a week) with her husband for the past 4 months. She is having regular periods. PAP in June 2022 was normal. Encouraged healthy eating habits and exercise to maintain a healthy weight. Discussed methods of fertility tracking.  Pt declined basal temperature monitoring. Discussed calendar method in detail. Handout provided. Advised to start taking prenatal vitamins. Given hx of fertility issues in the past it is reasonable for pt to see REI soon.  - Referral placed to reproductive endocrinology

## 2020-12-09 ENCOUNTER — Telehealth: Payer: Self-pay | Admitting: *Deleted

## 2020-12-25 ENCOUNTER — Telehealth: Payer: Self-pay | Admitting: *Deleted

## 2020-12-25 NOTE — Telephone Encounter (Signed)
   Telephone encounter was:  Successful.  12/25/2020 Name: Katrina Brewer MRN: [id number] DOB: 1990-12-07  Katrina Brewer is a 30 y.o. year old female who is a primary care patient of Katha Cabal, DO . The community resource team was consulted for assistance with [phone] [facility] [employer] interpreter called patient and asked about needs for hearing aid going to send information from dept deaf and hard of hearing   Care guide performed the following interventions: Patient provided with information about care guide support team and interviewed to confirm resource needs Follow up call placed to community resources to determine status of patients referral.  Follow Up Plan:  No further follow up planned at this time. The patient has been provided with needed resources.  Alois Cliche -[facility] Guide , Embedded Care Coordination [facility], Care Management  [phone] [address] [place] , [place] [address] Email : [email or web address]

## 2021-02-17 ENCOUNTER — Encounter: Payer: Self-pay | Admitting: Family Medicine

## 2021-02-17 ENCOUNTER — Ambulatory Visit (INDEPENDENT_AMBULATORY_CARE_PROVIDER_SITE_OTHER): Payer: Medicaid Other | Admitting: Family Medicine

## 2021-02-17 VITALS — BP 117/74 | HR 88 | Ht 63.0 in | Wt 163.6 lb

## 2021-02-17 DIAGNOSIS — R350 Frequency of micturition: Secondary | ICD-10-CM | POA: Diagnosis not present

## 2021-02-17 DIAGNOSIS — N898 Other specified noninflammatory disorders of vagina: Secondary | ICD-10-CM | POA: Diagnosis present

## 2021-02-17 DIAGNOSIS — N979 Female infertility, unspecified: Secondary | ICD-10-CM | POA: Diagnosis not present

## 2021-02-17 LAB — POCT URINALYSIS DIP (MANUAL ENTRY)
Bilirubin, UA: NEGATIVE
Blood, UA: NEGATIVE
Glucose, UA: NEGATIVE mg/dL
Ketones, POC UA: NEGATIVE mg/dL
Nitrite, UA: NEGATIVE
Protein Ur, POC: NEGATIVE mg/dL
Spec Grav, UA: 1.02 (ref 1.010–1.025)
Urobilinogen, UA: 0.2 E.U./dL
pH, UA: 7 (ref 5.0–8.0)

## 2021-02-17 LAB — POCT WET PREP (WET MOUNT)
Clue Cells Wet Prep Whiff POC: NEGATIVE
Trichomonas Wet Prep HPF POC: ABSENT

## 2021-02-17 LAB — POCT UA - MICROSCOPIC ONLY

## 2021-02-17 NOTE — Patient Instructions (Addendum)
?? ???? ????? ??? ?? ????? ????? ???: ???? ????? ??? ??   [facility] ??? ???? ??? ???? ??????. ????: [address] [place], [place] [address] ????: [phone]  ???? ????? ????: ?? ???? ?????? ?? ????? ?? ???? ??????? ??????? ????. ??? ????? ?? ?? ??? ??????:  Regarding your referral to reproductive specialist:  Call [facility] to schedule an appointment.  Address: [address] [place] [place], [place] [address] Phone: [phone]  For your vaginal irritation: Use Vaseline ointment to serve as a protective barrier.  Consider picking up this soap:

## 2021-02-17 NOTE — Progress Notes (Signed)
   SUBJECTIVE:   CHIEF COMPLAINT / HPI:   Chief Complaint  Patient presents with   Fertility   Possible uti    Fertility Katrina Brewer is a 30 y.o. female here for here to discuss fertility. Her and her husband have intercourse two to three times a week. Has not been taking birth control.  Patient's last menstrual period was 01/22/2021. Pt and her husband have been trying to get pregnant for the past 7-8 months. She was told in [place] that she had "weak eggs". She has two children (15 yo boy, 78 yo boy) and had a hard time getting pregnant with her first child.  She has not yet followed up with reproductive endocrinology.   Pt unsure when she first started menstruating believes around 12-13. Periods come every month and last about 3-5 days. Has 3 days of heavy bleeding that tapers off.    Dysuria Pt reports burning with urinary, urinary frequency and suprapubic abdominal pain. Denies fevers.  Is not concerned for STDs.     PERTINENT  PMH / PSH: reviewed and updated as appropriate   OBJECTIVE:   BP 117/74   Pulse 88   Ht 5\' 3"  (1.6 m)   Wt 163 lb 9.6 oz (74.2 kg)   LMP 01/22/2021   SpO2 100%   BMI 28.98 kg/m    GEN: well appearing female in no acute distress  CVS: well perfused  RESP: speaking in full sentences without pause  ABD: soft, non-tender, non-distended, no palpable masses  Pelvic exam: normal external genitalia, vulva, VAGINA and CERVIX: normal appearing cervix without discharge or lesions, ADNEXA: normal adnexa in size, nontender and no masses, WET MOUNT done - results: negative for pathogens, normal epithelial cells, KOH done, exam chaperoned by CMA.    ASSESSMENT/PLAN:   Female fertility problem Pt is a 30 yo G31P2002 female with history of difficulty getting pregnant in G1P80.   Continues to have intercourse with her husband several times a week.  Pap in June 2022 was normal.  Provided patient with referral at last visit to reproductive  endocrinology however she has not followed up.  Provided patient today with contact information for  [facility] to discuss fertility.   External Vaginal irritation Suspect that this is due to detergent/soap that she uses that she got from a store that was made in July 2022.  Wet prep unremarkable.  UA with some leukocytosis but no concern for cystitis. She declined STD testing today.  Advised to switch to sensitive/ gentle non-fragrance containing soap.  Use barrier protection/emollients.  If symptoms continue recommend STD testing.  [place], DO PGY-3, [facility] 02/17/2021

## 2021-02-20 NOTE — Assessment & Plan Note (Signed)
Pt is a 30 yo G62P2002 female with history of difficulty getting pregnant in [place].   Continues to have intercourse with her husband several times a week.  Pap in June 2022 was normal.  Provided patient with referral at last visit to reproductive endocrinology however she has not followed up.  Provided patient today with contact information for  [facility] to discuss fertility.

## 2021-04-05 NOTE — L&D Delivery Note (Signed)
OB/GYN Faculty Practice Delivery Note  Mamta Shefali Ng is a 31 y.o. [id number] s/p VBAC at [redacted]w[redacted]d. She was admitted for SOL.   ROM: 1h 56m with clear fluid GBS Status: neg Maximum Maternal Temperature: 98.1  Labor Progress: Ms Henkin was admitted in active labor; she had an epidural placed and then AROM prior to progressing to complete and pushing approx an hour.  Delivery Date/Time: September 23rd, 2023 at 0518 Delivery: Called to room and patient was complete and pushing. Head delivered LOA. Very loose nuchal cord present- reduced prior to delivery. Shoulder and body delivered in usual fashion. Infant with spontaneous cry, placed on mother's abdomen, dried and stimulated. Cord clamped x 2 after 1-minute delay, and cut by FOB. Cord blood drawn. Cord avulsed with CCT therefore placenta delivered via manual extraction. Fundus firm with massage and Pitocin. Labia, perineum, vagina, and cervix inspected and found to be intact.   Placenta: manually extracted; to path; appears complete Complications: none Lacerations: none EBL: 427cc Analgesia: epidural  Postpartum Planning [x]  message to sent to schedule follow-up  [x]  vaccines UTD  Infant: boy  APGARs 9/9  3760g (8lb 4.6oz)  Myrtis Ser, CNM  12/26/2021 6:18 AM

## 2021-05-08 ENCOUNTER — Telehealth: Payer: Self-pay | Admitting: Family Medicine

## 2021-05-08 DIAGNOSIS — N889 Noninflammatory disorder of cervix uteri, unspecified: Secondary | ICD-10-CM

## 2021-05-08 DIAGNOSIS — N979 Female infertility, unspecified: Secondary | ICD-10-CM

## 2021-05-08 NOTE — Telephone Encounter (Signed)
New referral placed for Gynecology Katrina Starr, MD  [facility] Teaching Service

## 2021-05-22 ENCOUNTER — Other Ambulatory Visit (HOSPITAL_COMMUNITY): Payer: Self-pay

## 2021-05-22 ENCOUNTER — Ambulatory Visit (INDEPENDENT_AMBULATORY_CARE_PROVIDER_SITE_OTHER): Payer: Medicaid Other

## 2021-05-22 ENCOUNTER — Ambulatory Visit: Payer: No Typology Code available for payment source | Admitting: Student

## 2021-05-22 ENCOUNTER — Encounter: Payer: Self-pay | Admitting: Family Medicine

## 2021-05-22 ENCOUNTER — Other Ambulatory Visit: Payer: Self-pay

## 2021-05-22 ENCOUNTER — Ambulatory Visit (INDEPENDENT_AMBULATORY_CARE_PROVIDER_SITE_OTHER): Payer: Medicaid Other | Admitting: Family Medicine

## 2021-05-22 VITALS — BP 130/72 | HR 101 | Ht 63.0 in | Wt 158.2 lb

## 2021-05-22 DIAGNOSIS — Z3201 Encounter for pregnancy test, result positive: Secondary | ICD-10-CM | POA: Diagnosis present

## 2021-05-22 DIAGNOSIS — Z23 Encounter for immunization: Secondary | ICD-10-CM

## 2021-05-22 DIAGNOSIS — O219 Vomiting of pregnancy, unspecified: Secondary | ICD-10-CM

## 2021-05-22 DIAGNOSIS — R7612 Nonspecific reaction to cell mediated immunity measurement of gamma interferon antigen response without active tuberculosis: Secondary | ICD-10-CM

## 2021-05-22 LAB — POCT URINE PREGNANCY: Preg Test, Ur: POSITIVE — AB

## 2021-05-22 MED ORDER — PRENATAL 19 PO CHEW
1.0000 | CHEWABLE_TABLET | Freq: Every day | ORAL | 3 refills | Status: DC
  Filled 2021-05-22: qty 34, 34d supply, fill #0

## 2021-05-22 MED ORDER — DOXYLAMINE-PYRIDOXINE 10-10 MG PO TBEC
1.0000 | DELAYED_RELEASE_TABLET | Freq: Two times a day (BID) | ORAL | 1 refills | Status: DC
  Filled 2021-05-22: qty 180, 90d supply, fill #0
  Filled 2021-05-22: qty 60, 30d supply, fill #0

## 2021-05-22 NOTE — Progress Notes (Signed)
° ° °  SUBJECTIVE:   CHIEF COMPLAINT: positive home pregnancy test HPI:   The patient speaks Dari as their primary language.  An interpreter was used for the entire visit.   Katrina Brewer is a 31 y.o.  with history notable for  H. Pylori and chronic headaches presenting with a positive pregnancy test. LMP is December 21st, first day of LMP. She does NOT have regular menses. No vaginal discharge, bleeding, or pelvic pain. Having mild nausea in AM, vomiting 1-2 times. No headaches, abdominal pain, pelvic pain. Has two boys at home. Prior C/S for (? Headaches) in pregnancy, followed by SVD. No other complications she reports.   PERTINENT  PMH / PSH/Family/Social History : H. Pylori s/p treatment, chronic headaches, prior C/S   OBJECTIVE:   BP 130/72    Pulse (!) 101    Wt 158 lb 3.2 oz (71.8 kg)    SpO2 100%    BMI 28.02 kg/m   Today's weight:  Last Weight  Most recent update: 05/22/2021 10:55 AM    Weight  71.8 kg (158 lb 3.2 oz)            Review of prior weights: Filed Weights   05/22/21 1054  Weight: 158 lb 3.2 oz (71.8 kg)     Cardiac: Regular rate and rhythm. Normal S1/S2. No murmurs, rubs, or gallops appreciated. Lungs: Clear bilaterally to ascultation.  Abdomen: Normoactive bowel sounds. No tenderness to deep or light palpation. No rebound or guarding.  No suprapubic pain  Psych: Pleasant and appropriate    ASSESSMENT/PLAN:   Positive Pregnancy Test  - Routine labs, Hep C negative on arrival - POC pregnancy positive - LMP places her at 8 weeks 1 day today, EDD 12/30/21 - Dating scan given irregular menses  - 60 minute initial OB-will likely prefer female providers throughout pregnancy   Mildly elevated blood pressure, not in range for chronic HTN,  repeat 118/78. Monitor.   Overweight status in pregnancy- consider early 1 hour.   History of suppressed TSH- consider repeat at follow up (subject to be abnormal in pregnancy given HCG)   HCM- Flu and COVID given  today    Terisa Starr, MD  Family Medicine Teaching Service  [facility]

## 2021-05-22 NOTE — Patient Instructions (Addendum)
It was wonderful to see you today.  Please bring ALL of your medications with you to every visit.   Today we talked about:  For Frozan to read  --You can take Tylenol for headache or pain - You can take famotidine for stomach pain - You can take magnesium for headache    NO IBUPROFEN  --Go to [facility] pharmacy for a prenatal vitamin and nausea pill  Thank you for choosing [facility].   Estimated due date is December 30, 2021   If the ultrasound date does not work, you can call [phone] to reschedule   Please call [phone] with any questions about today's appointment.  Please be sure to schedule follow up at the front  desk before you leave today.   Terisa Starr, MD  Family Medicine

## 2021-05-22 NOTE — Assessment & Plan Note (Signed)
Completed treatment.

## 2021-05-25 ENCOUNTER — Telehealth: Payer: Self-pay | Admitting: Family Medicine

## 2021-05-25 DIAGNOSIS — R8271 Bacteriuria: Secondary | ICD-10-CM

## 2021-05-25 LAB — CBC/D/PLT+RPR+RH+ABO+RUBIGG...
Antibody Screen: NEGATIVE
Basophils Absolute: 0 10*3/uL (ref 0.0–0.2)
Basos: 0 %
Bilirubin, UA: NEGATIVE
EOS (ABSOLUTE): 0 10*3/uL (ref 0.0–0.4)
Eos: 0 %
Glucose, UA: NEGATIVE
HCV Ab: NONREACTIVE
HIV Screen 4th Generation wRfx: NONREACTIVE
Hematocrit: 31.8 % — ABNORMAL LOW (ref 34.0–46.6)
Hemoglobin: 10.9 g/dL — ABNORMAL LOW (ref 11.1–15.9)
Hepatitis B Surface Ag: NEGATIVE
Immature Grans (Abs): 0.1 10*3/uL (ref 0.0–0.1)
Immature Granulocytes: 1 %
Ketones, UA: NEGATIVE
Lymphocytes Absolute: 1.3 10*3/uL (ref 0.7–3.1)
Lymphs: 13 %
MCH: 26.9 pg (ref 26.6–33.0)
MCHC: 34.3 g/dL (ref 31.5–35.7)
MCV: 79 fL (ref 79–97)
Monocytes Absolute: 0.5 10*3/uL (ref 0.1–0.9)
Monocytes: 5 %
Neutrophils Absolute: 8.1 10*3/uL — ABNORMAL HIGH (ref 1.4–7.0)
Neutrophils: 81 %
Nitrite, UA: NEGATIVE
Platelets: 385 10*3/uL (ref 150–450)
Protein,UA: NEGATIVE
RBC, UA: NEGATIVE
RBC: 4.05 x10E6/uL (ref 3.77–5.28)
RDW: 13.3 % (ref 11.7–15.4)
RPR Ser Ql: NONREACTIVE
Rh Factor: POSITIVE
Rubella Antibodies, IGG: 8.02 index (ref >0.99)
Specific Gravity, UA: 1.016 (ref 1.005–1.030)
Urobilinogen, Ur: 0.2 mg/dL (ref 0.2–1.0)
WBC: 10.1 10*3/uL (ref 3.4–10.8)
pH, UA: 6 (ref 5.0–7.5)

## 2021-05-25 LAB — HGB FRACTIONATION CASCADE
Hgb A2: 2.5 % (ref 1.8–3.2)
Hgb A: 97.5 % (ref 96.4–98.8)
Hgb F: 0 % (ref 0.0–2.0)
Hgb S: 0 %

## 2021-05-25 LAB — MICROSCOPIC EXAMINATION: Casts: NONE SEEN /lpf

## 2021-05-25 LAB — HCV INTERPRETATION

## 2021-05-25 LAB — URINE CULTURE, OB REFLEX

## 2021-05-25 MED ORDER — CEPHALEXIN 500 MG PO CAPS
500.0000 mg | ORAL_CAPSULE | Freq: Two times a day (BID) | ORAL | 0 refills | Status: DC
  Filled 2021-05-25: qty 10, 5d supply, fill #0

## 2021-05-25 NOTE — Telephone Encounter (Signed)
Called with interpreter.  Spoke with patient's husband. Rx for cephalexin to pharmacy, 500 mg BID. Will need test of cure--S. Saprophyticus can be resistant to this. If persistent, can trial macrobid in second trimester.  Patient asked to call sponsors as well. Will do so and update wife.

## 2021-05-26 ENCOUNTER — Telehealth: Payer: Self-pay | Admitting: Family Medicine

## 2021-05-26 ENCOUNTER — Other Ambulatory Visit (HOSPITAL_COMMUNITY): Payer: Self-pay

## 2021-05-26 NOTE — Telephone Encounter (Signed)
Attempted to call again about antibiotic to confirm pick up.  Dorris Singh, MD  Family Medicine Teaching Service

## 2021-05-27 ENCOUNTER — Other Ambulatory Visit (HOSPITAL_COMMUNITY): Payer: Self-pay

## 2021-06-01 ENCOUNTER — Other Ambulatory Visit: Payer: Self-pay

## 2021-06-01 ENCOUNTER — Ambulatory Visit
Admission: RE | Admit: 2021-06-01 | Discharge: 2021-06-01 | Disposition: A | Discharge status: Home / Self Care | Payer: Medicaid Other | Source: Ambulatory Visit | Attending: Family Medicine | Admitting: Family Medicine

## 2021-06-01 DIAGNOSIS — Z3201 Encounter for pregnancy test, result positive: Secondary | ICD-10-CM | POA: Insufficient documentation

## 2021-06-02 ENCOUNTER — Telehealth: Payer: Self-pay | Admitting: Family Medicine

## 2021-06-02 NOTE — Telephone Encounter (Signed)
The patient speaks [place] as their primary language.  An interpreter was used for the entire call. Attempted to call patient. Reached voicemail, left generic voicemail to call back.   Terisa Starr, MD  Family Medicine Teaching Service  .

## 2021-06-03 ENCOUNTER — Encounter: Payer: No Typology Code available for payment source | Admitting: Family Medicine

## 2021-06-05 ENCOUNTER — Encounter: Payer: Self-pay | Admitting: Family Medicine

## 2021-06-05 ENCOUNTER — Ambulatory Visit (INDEPENDENT_AMBULATORY_CARE_PROVIDER_SITE_OTHER): Payer: Medicaid Other | Admitting: Family Medicine

## 2021-06-05 ENCOUNTER — Other Ambulatory Visit: Payer: Self-pay

## 2021-06-05 VITALS — BP 95/62 | HR 79 | Temp 98.0°F | Wt 158.0 lb

## 2021-06-05 DIAGNOSIS — O219 Vomiting of pregnancy, unspecified: Secondary | ICD-10-CM

## 2021-06-05 DIAGNOSIS — R8271 Bacteriuria: Secondary | ICD-10-CM

## 2021-06-05 DIAGNOSIS — Z3401 Encounter for supervision of normal first pregnancy, first trimester: Secondary | ICD-10-CM

## 2021-06-05 NOTE — Patient Instructions (Signed)
Follow up in 4 weeks.  ? ?If you have any vaginal bleeding go to [facility]U.  ? ?Take one tablet in the morning and two tablets at night of the diclegis.  ? ?Dr. Salvadore Dom ?

## 2021-06-05 NOTE — Progress Notes (Signed)
?Patient Name: Katrina Brewer ?Date of Birth: 09/24/1990 ?[facility] Initial Prenatal Visit ? ?Katrina Brewer is a 31 y.o. year old G3P2002 at Unknown who presents for her initial prenatal visit. ?Pregnancy  is somewhat  planned.  ?She reports  emesis 2x/daily; taking diclegis twice daily . ?She is taking a prenatal vitamin.  ?She denies pelvic pain or vaginal bleeding.  ? ?Pregnancy Dating: ?The patient is dated by early ultrasound.  ?LMP: 03/31/22 ?Period is certain:  Yes.  ?Periods were regular:  Yes.  ?LMP was a typical period:  Yes.  ?Using hormonal contraception in 3 months prior to conception: No ? ?Lab Review: ?Blood type: A ?Rh Status: + ?Antibody screen: Negative ?HIV: Negative ?RPR: Negative ?Hemoglobin electrophoresis reviewed: Yes ?Results of OB urine culture are: Positive for staph saprophyticus ?Rubella: Immune ?Hep C Ab: Negative ?Varicella status is Unknown ? ?PMH: Reviewed and as detailed below: ?HTN: No  ?Gestational Hypertension/preeclampsia: No  ?Type 1 or 2 Diabetes: No  ?Depression:  No  ?Seizure disorder:  No ?VTE: No ,  ?History of STI No,  ?Abnormal Pap smear:  No, ?Genital herpes simplex:  No  ? ?PSH: ?Gynecologic Surgery:  no ?Surgical history reviewed, notable for: n/a ? ?Obstetric History: ?Obstetric history tab updated and reviewed.  ?Summary of prior pregnancies: G3P2002, C/S followed by VBAC in [place] ?Cesarean delivery: Yes  ?Gestational Diabetes:  No ?Hypertension in pregnancy: No ?History of preterm birth: No ?History of LGA/SGA infant:  No ?History of shoulder dystocia: No ?Indications for referral were reviewed, and the patient has no obstetric indications for referral to High Risk OB Clinic at this time.  ? ?Social History: ?Partner's name: Jamesetta Orleans Such  ?Tobacco use: No ?Alcohol use:  No ?Other substance use:  No ? ?Current Medications:  ?PNV, daily ?Diclegis one in the morning, one at night ?Reviewed and appropriate in pregnancy.  ? ?Genetic and  Infection Screen: ?Flow Sheet Updated Yes ? ?Prenatal Exam: ?Gen: Well nourished, well developed.  No distress.  Vitals noted. ?HEENT: Normocephalic, atraumatic.  Neck supple without cervical lymphadenopathy, thyromegaly or thyroid nodules.   ?CV: RRR no murmur, gallops or rubs ?Lungs: CTA B.  Normal respiratory effort without wheezes or rales. ?Abd: soft, NTND. +BS.  Uterus not appreciated above pelvis. ?Ext: No clubbing, cyanosis or edema. ?Psych: Normal grooming and dress.  Not depressed or anxious appearing.  Normal thought content and process without flight of ideas or looseness of associations ? ?Fetal heart tones: not obtained via doppler at this gestational age.  ? ?Assessment/Plan: ? ?Katrina Brewer is a 31 y.o. [id number] at Unknown who presents to initiate prenatal care. She is doing well.  ? ?Routine prenatal care: ?As dating is reliable, a dating ultrasound was ordered at prior visit, reviewed today. Dating tab updated. ?Pre-pregnancy weight updated. Expected weight gain this pregnancy is 25-35 pounds  ?Prenatal labs reviewed, notable for bacteruria treated with 5 days of keflex. TOC today. ?Indications for referral to HROB were reviewed and the patient does not meet criteria for referral.  ?Medication list reviewed and updated.  ?Recommended patient see a dentist for regular care.  ?Bleeding and pain precautions reviewed. ?Importance of prenatal vitamins reviewed.  ?Genetic screening not offered today. Discuss at follow up.  ?The patient has the following indications for aspirinto begin 81 mg at 12-16 weeks: ?One high risk condition: no single high risk condition  ?MORE than one moderate risk condition: none ?Aspirin was not  recommended today based upon above risk  factors (one high risk condition or more than one moderate risk factor)  ?The patient will not be age 81 or over at time of delivery. Referral to genetic counseling was not offered today.  ?The patient has the following risk factors for  preexisting diabetes: BMI > 25 and sedentary lifestyle. An early 1 hour glucose tolerance test was not ordered. Consider at follow up.  ?Pregnancy Medical Home and PHQ-9 forms completed, problems noted: No ? ?2. Pregnancy issues include the following which were addressed today:  ?Nausea and vomiting in pregnancy ?Increase diclegis to two tablets in the morning and one at night.  ? ?Asymptomatic bacteriuria ?Received 5 days of abx. TOC today ?- Culture, OB Urine ?  ?Follow up 4 weeks for next prenatal visit. This to address at follow up: genetic testing, dates of prior births, PHQ9, and varicella testing ? ? ? ? ? ? ?

## 2021-06-06 DIAGNOSIS — Z3493 Encounter for supervision of normal pregnancy, unspecified, third trimester: Secondary | ICD-10-CM | POA: Insufficient documentation

## 2021-06-06 DIAGNOSIS — Z3401 Encounter for supervision of normal first pregnancy, first trimester: Secondary | ICD-10-CM | POA: Insufficient documentation

## 2021-06-07 LAB — CULTURE, OB URINE

## 2021-06-07 LAB — URINE CULTURE, OB REFLEX: Organism ID, Bacteria: NO GROWTH

## 2021-06-08 ENCOUNTER — Encounter: Payer: Self-pay | Admitting: Family Medicine

## 2021-06-16 ENCOUNTER — Encounter (HOSPITAL_COMMUNITY): Payer: Self-pay

## 2021-06-16 ENCOUNTER — Other Ambulatory Visit: Payer: Self-pay

## 2021-06-16 ENCOUNTER — Ambulatory Visit (HOSPITAL_COMMUNITY)
Admission: EM | Admit: 2021-06-16 | Discharge: 2021-06-16 | Disposition: A | Discharge status: Home / Self Care | Payer: Medicaid Other | Attending: Family Medicine | Admitting: Family Medicine

## 2021-06-16 DIAGNOSIS — R519 Headache, unspecified: Secondary | ICD-10-CM

## 2021-06-16 DIAGNOSIS — O99511 Diseases of the respiratory system complicating pregnancy, first trimester: Secondary | ICD-10-CM

## 2021-06-16 DIAGNOSIS — K0889 Other specified disorders of teeth and supporting structures: Secondary | ICD-10-CM | POA: Diagnosis not present

## 2021-06-16 DIAGNOSIS — Z3A10 10 weeks gestation of pregnancy: Secondary | ICD-10-CM

## 2021-06-16 DIAGNOSIS — O99612 Diseases of the digestive system complicating pregnancy, second trimester: Secondary | ICD-10-CM | POA: Diagnosis not present

## 2021-06-16 DIAGNOSIS — J019 Acute sinusitis, unspecified: Secondary | ICD-10-CM

## 2021-06-16 MED ORDER — AMOXICILLIN-POT CLAVULANATE 875-125 MG PO TABS: 1.0000 | ORAL_TABLET | Freq: Two times a day (BID) | ORAL | 0 refills | Status: DC

## 2021-06-16 NOTE — ED Triage Notes (Signed)
Pt reports dental pain, body aches and headache for 2 days. She is [redacted] weeks pregnant.  ?

## 2021-06-16 NOTE — ED Provider Notes (Signed)
?[facility] ? ? ? ?CSN: [id number] ?Arrival date & time: 06/16/21  1104 ? ? ?  ? ?History   ?Chief Complaint ?No chief complaint on file. ? ? ?HPI ?Katrina Brewer is a 31 y.o. female.  ? ?Interpreter used today.  ?She is here for headache x several days.    Located at the tempes bilaterally; Runny nose and congestion x 3 days.  She is having pain in all her teeth since last night, the upper teeth.   ?No fevers, but feels chills at night.  ?No cough;  ?Some ear pain.  ?She is currently [redacted] weeks pregnant.  ? ?Past Medical History:  ?Diagnosis Date  ? Overweight   ? Refugee health examination   ? ? ?Patient Active Problem List  ? Diagnosis Date Noted  ? Supervision of normal first pregnancy in first trimester 06/06/2021  ? Abnormal appearance of cervix 09/25/2020  ? Chronic nonintractable headache 09/18/2020  ? Nonimmune to hepatitis B virus 08/15/2020  ? Subclinical hyperthyroidism 08/15/2020  ? Elevated LDL cholesterol level 08/15/2020  ? Positive QuantiFERON-TB Gold test 08/07/2020  ? Refugee health examination 07/29/2020  ? Poor dentition 07/29/2020  ? 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 Perforation of right tympanic membrane 07/29/2020  ? ? ?Past Surgical History:  ?Procedure Laterality Date  ? CESAREAN SECTION  10/2013  ? ? ?OB History   ? ? Gravida  ?3  ? Para  ?2  ? Term  ?2  ? Preterm  ?   ? AB  ?   ? Living  ?2  ?  ? ? SAB  ?   ? IAB  ?   ? Ectopic  ?   ? Multiple  ?   ? Live Births  ?   ?   ?  ? Obstetric Comments  ?1 C/s and one term vaginal delivery   ?  ? ?  ? ? ? ?Home Medications   ? ?Prior to Admission medications   ?Medication Sig Start Date End Date Taking? Authorizing Provider  ?Doxylamine-Pyridoxine (DICLEGIS) 10-10 MG TBEC Take 1 tablet by mouth in the morning and at night for vomiting 05/22/21   Westley Chandler, MD  ?Prenatal Vit-Fe Fumarate-FA (PRENATAL 19) tablet Take 1 tablet by mouth daily. 05/22/21   Westley Chandler, MD  ? ? ?Family History ?History reviewed. No pertinent family  history. ? ?Social History ?Social History  ? ?Tobacco Use  ? Smoking status: Never  ? Smokeless tobacco: Never  ?Vaping Use  ? Vaping Use: Never used  ?Substance Use Topics  ? Alcohol use: Never  ? Drug use: Never  ? ? ? ?Allergies   ?Patient has no known allergies. ? ? ?Review of Systems ?Review of Systems  ?HENT:  Positive for congestion, dental problem and rhinorrhea.   ?Respiratory:  Negative for cough and shortness of breath.   ?Cardiovascular: Negative.   ?Gastrointestinal: Negative.   ? ? ?Physical Exam ?Triage Vital Signs ?ED Triage Vitals [06/16/21 1135]  ?Enc Vitals Group  ?   BP (!) 152/92  ?   Pulse Rate 78  ?   Resp 16  ?   Temp 97.9 ?F (36.6 ?C)  ?   Temp Source Oral  ?   SpO2 100 %  ?   Weight   ?   Height   ?   Head Circumference   ?   Peak Flow   ?   Pain Score   ?   Pain Loc   ?  Pain Edu?   ?   Excl. in GC?   ? ?No data found. ? ?Updated Vital Signs ?BP (!) 152/92 (BP Location: Left Arm)   Pulse 78   Temp 97.9 ?F (36.6 ?C) (Oral)   Resp 16   LMP 01/22/2021   SpO2 100%  ? ?Visual Acuity ?Right Eye Distance:   ?Left Eye Distance:   ?Bilateral Distance:   ? ?Right Eye Near:   ?Left Eye Near:    ?Bilateral Near:    ? ?Physical Exam ?Constitutional:   ?   Appearance: Normal appearance.  ?HENT:  ?   Head: Normocephalic and atraumatic.  ?   Right Ear: Tympanic membrane normal.  ?   Left Ear: Tympanic membrane normal.  ?   Nose: Congestion and rhinorrhea present.  ?   Right Sinus: Maxillary sinus tenderness present.  ?   Left Sinus: Maxillary sinus tenderness present.  ?   Mouth/Throat:  ?   Mouth: Mucous membranes are moist. No oral lesions.  ?   Dentition: Normal dentition. No dental tenderness, dental caries or gum lesions.  ?Cardiovascular:  ?   Rate and Rhythm: Normal rate and regular rhythm.  ?Pulmonary:  ?   Effort: Pulmonary effort is normal.  ?   Breath sounds: Normal breath sounds.  ?Musculoskeletal:  ?   Cervical back: Normal range of motion and neck supple. Tenderness present.   ?Neurological:  ?   Mental Status: She is alert.  ? ? ? ?UC Treatments / Results  ?Labs ?(all labs ordered are listed, but only abnormal results are displayed) ?Labs Reviewed - No data to display ? ?EKG ? ? ?Radiology ?No results found. ? ?Procedures ?Procedures (including critical care time) ? ?Medications Ordered in UC ?Medications - No data to display ? ?Initial Impression / Assessment and Plan / UC Course  ?I have reviewed the triage vital signs and the nursing notes. ? ?Pertinent labs & imaging results that were available during my care of the patient were reviewed by me and considered in my medical decision making (see chart for details). ? ?Patient seen today for headache, URI symptoms, and teeth pain.  She is also [redacted] weeks pregnant.  I am treating her for sinusitis today with augmentin. She may take tylenol for pain, along with zyrtec.  Follow up if not improving as anticipated.  ?  ?Final Clinical Impressions(s) / UC Diagnoses  ? ?Final diagnoses:  ?Acute non-recurrent sinusitis, unspecified location  ?Pain in a tooth or teeth  ?Acute nonintractable headache, unspecified headache type  ?[redacted] weeks gestation of pregnancy  ? ? ? ?Discharge Instructions   ? ?  ?You were seen today for sinus infection.  I have sent out an antibiotic to take twice/day x 7 days.  You may use tylenol for pain only.  You cannot use motrin or ibuprofen.  You can also trial over the counter zyrtec to take daily for sinus congestion and drainage.  ? ? ? ?ED Prescriptions   ? ? Medication Sig Dispense Auth. Provider  ? amoxicillin-clavulanate (AUGMENTIN) 875-125 MG tablet Take 1 tablet by mouth every 12 (twelve) hours. 14 tablet Jannifer Franklin, MD  ? ?  ? ?PDMP not reviewed this encounter. ?  ?Jannifer Franklin, MD ?06/16/21 1231 ? ?

## 2021-06-16 NOTE — Discharge Instructions (Addendum)
You were seen today for sinus infection.  I have sent out an antibiotic to take twice/day x 7 days.  You may use tylenol for pain only.  You cannot use motrin or ibuprofen.  You can also trial over the counter zyrtec to take daily for sinus congestion and drainage.  ?

## 2021-06-24 ENCOUNTER — Encounter: Payer: Self-pay | Admitting: Obstetrics and Gynecology

## 2021-07-01 ENCOUNTER — Encounter: Payer: No Typology Code available for payment source | Admitting: Family Medicine

## 2021-07-08 ENCOUNTER — Ambulatory Visit (INDEPENDENT_AMBULATORY_CARE_PROVIDER_SITE_OTHER): Payer: Medicaid Other | Admitting: Family Medicine

## 2021-07-08 VITALS — BP 100/80 | HR 78 | Temp 98.0°F | Wt 148.4 lb

## 2021-07-08 DIAGNOSIS — N898 Other specified noninflammatory disorders of vagina: Secondary | ICD-10-CM

## 2021-07-08 DIAGNOSIS — Z3401 Encounter for supervision of normal first pregnancy, first trimester: Secondary | ICD-10-CM

## 2021-07-08 DIAGNOSIS — Z87898 Personal history of other specified conditions: Secondary | ICD-10-CM

## 2021-07-08 NOTE — Patient Instructions (Signed)
It was wonderful to see you today. ? ?Please bring ALL of your medications with you to every visit.  ? ?Today we talked about: ? ?Getting anatomy ultrasound at 18 weeks ?Getting genetic testing done at follow-up visit ?Getting 1 hour glucose test done at follow-up visit ? ?Please continue to eat frequent small meals. ? ?Follow-up in 4 weeks.  Continue prenatal vitamin. ? ?Please be sure to schedule follow up at the front  desk before you leave today.  ? ?If you haven't already, sign up for My Chart to have easy access to your labs results, and communication with your primary care physician. ? ?Please call the clinic at [phone] if your symptoms worsen or you have any concerns. It was our pleasure to serve you. ? ?Dr. Janus Molder ? ?

## 2021-07-08 NOTE — Progress Notes (Signed)
?  Patient Name: [name] ?Date of Birth: Aug 06, 1990 ?[facility] Prenatal Visit ? ?Katrina Brewer is a 31 y.o. G3P2002 at [redacted]w[redacted]d here for routine follow up. She is dated by early ultrasound.  She reports  internal vaginal burning. She states she has had this with her prior pregnancies. Denies dysuria .  She denies vaginal bleeding.  See flow sheet for details. ? ?Vitals:  ? 07/08/21 1344  ?BP: 100/80  ?Pulse: 78  ?Temp: 98 ?F (36.7 ?C)  ? ? ? ?A/P: Pregnancy at [redacted]w[redacted]d.  Doing well.   ? ?Routine Prenatal Care:  ?Dating reviewed, dating tab is correct ?Fetal heart tones Appropriate ?Influenza vaccine not administered as not influenza season.   ?COVID vaccination was not discussed.  ?The patient has the following indication for screening preexisting diabetes: BMI >25  and a FDR with diabetes . ?Anatomy ultrasound ordered to be scheduled at 18-20 weeks. ?Patient is interested in genetic screening. As she is past 13 weeks and 6 days, a Quad screen  was offered.  ?Pregnancy education including expected weight gain in pregnancy, OTC medication use, continued use of prenatal vitamin, smoking cessation if applicable, and nutrition in pregnancy.   ?Bleeding and pain precautions reviewed. ?The patient has the following indications for aspirinto begin 81 mg at 12-16 weeks: ?One high risk condition: no single high risk condition  ?MORE than one moderate risk condition: low SES   ?Aspirin was not  recommended today based upon above risk factors (one high risk condition or more than one moderate risk factor)  ? ?2. Pregnancy issues include the following and were addressed as appropriate today: ? ?Weight loss in pregnancy- Fasting for ramadan. Cannot skip fast unless directed by a physican to do so. Advise eating frequent small meals in pregnancy is preferred. She voiced understanding. Will continue to monitor.  ?Vaginal burning-Small vaginal introitus tear <0.5cm that is likely culprit of burning sensation.  I suspect from shaving. Denies any genital surgeries. Otherwise unremarkable exam. Discussed lubrication such as olive oil or vasaline to minimize burning sensation.  ?Problem list  and pregnancy box updated: Yes.  ? ?Follow up 4 weeks. Will need to schedule anatomy US, 1 hr gtt and obtain quad screen at follow up. ? ?Dari interpreter used during entire encounter.  ?

## 2021-07-20 ENCOUNTER — Encounter: Payer: No Typology Code available for payment source | Admitting: Obstetrics & Gynecology

## 2021-08-07 ENCOUNTER — Ambulatory Visit: Payer: No Typology Code available for payment source | Admitting: Family Medicine

## 2021-08-14 ENCOUNTER — Ambulatory Visit (INDEPENDENT_AMBULATORY_CARE_PROVIDER_SITE_OTHER): Payer: Medicaid Other | Admitting: Family Medicine

## 2021-08-14 VITALS — BP 100/80 | HR 84 | Wt 155.6 lb

## 2021-08-14 DIAGNOSIS — N898 Other specified noninflammatory disorders of vagina: Secondary | ICD-10-CM | POA: Diagnosis not present

## 2021-08-14 DIAGNOSIS — Z3402 Encounter for supervision of normal first pregnancy, second trimester: Secondary | ICD-10-CM | POA: Diagnosis not present

## 2021-08-14 DIAGNOSIS — Z3401 Encounter for supervision of normal first pregnancy, first trimester: Secondary | ICD-10-CM

## 2021-08-14 LAB — POCT WET PREP (WET MOUNT)
Clue Cells Wet Prep Whiff POC: NEGATIVE
Trichomonas Wet Prep HPF POC: ABSENT

## 2021-08-14 LAB — POCT 1 HR PRENATAL GLUCOSE: Glucose 1 Hr Prenatal, POC: 137 mg/dL

## 2021-08-14 NOTE — Patient Instructions (Addendum)
-   Go to your scheduled [place] as below ? ?- Follow up with [place] in 4 weeks or sooner if needed ?- I will notify you when your labs are available  ? ?Dr. Salvadore Dom ?

## 2021-08-14 NOTE — Progress Notes (Addendum)
?  Patient Name: [name] ?Date of Birth: 01-30-1991 ?[facility] Prenatal Visit ? ?Katrina Brewer is a 31 y.o. G3P2002 at [redacted]w[redacted]d here for routine follow up. She is dated by early ultrasound.  She reports  vaginal irritation .  She denies vaginal bleeding.  See flow sheet for details. ? ?Vitals:  ? 08/14/21 1350  ?BP: 100/80  ?Pulse: 84  ?Pelvic exam: VULVA: normal appearing vulva with no masses, tenderness or lesions, VAGINA: vaginal discharge - white, copious, and curd-like, WET MOUNT done - results: pending, exam chaperoned by Leonia Corona. ? ? ? ? ?A/P: Pregnancy at [redacted]w[redacted]d.  Doing well.   ? ?Routine Prenatal Care:  ?Dating reviewed, dating tab is correct ?Fetal heart tones Appropriate ?Influenza vaccine not administered as not influenza season.   ?COVID vaccination was not discussed.  ?The patient has the following indication for screening preexisting diabetes: BMI >25  and a FDR with diabetes . ?Anatomy ultrasound ordered to be scheduled at 18-20 weeks. ?Patient is interested in genetic screening. As she is past 13 weeks and 6 days, a Quad screen  was offered.  ?Pregnancy education including expected weight gain in pregnancy, OTC medication use, continued use of prenatal vitamin, smoking cessation if applicable, and nutrition in pregnancy.   ?Bleeding and pain precautions reviewed. ?The patient has the following indications for aspirinto begin 81 mg at 12-16 weeks: ?One high risk condition: no single high risk condition  ?MORE than one moderate risk condition: low SES   ?Aspirin was not  recommended today based upon above risk factors (one high risk condition or more than one moderate risk factor)  ? ?2. Pregnancy issues include the following and were addressed as appropriate today: ? ?Vaginal burning-Denies dysuria, fever. Wet mount performed today. Vaginal discharge did appear to be thick, white, and copious.  ?Weight loss in pregnancy- Has had adequate weight gain since last  visit ?Problem list  and pregnancy box updated: Yes.  ? ?Follow up 4 weeks with [facility] faculty clinic. ? ? ?

## 2021-08-16 LAB — AFP TETRA
DIA Mom Value: 1.09
DIA Value (EIA): 190.61 pg/mL
DSR (By Age)    1 IN: 578
DSR (Second Trimester) 1 IN: 9738
Gestational Age: 20 WEEKS
MSAFP Mom: 1.26
MSAFP: 72.1 ng/mL
MSHCG Mom: 0.37
MSHCG: 9197 m[IU]/mL
Maternal Age At EDD: 31.4 yr
Osb Risk: 5411
T18 (By Age): 1:2253 {titer}
Test Results:: NEGATIVE
Weight: 155 [lb_av]
uE3 Mom: 1.02
uE3 Value: 2.23 ng/mL

## 2021-08-18 ENCOUNTER — Telehealth: Payer: Self-pay | Admitting: Family Medicine

## 2021-08-18 MED ORDER — CLOTRIMAZOLE 1 % VA CREA: 1.0000 | TOPICAL_CREAM | Freq: Every day | VAGINAL | 0 refills | Status: AC

## 2021-08-18 NOTE — Telephone Encounter (Signed)
-----   Message from Billey Co, MD sent at 08/18/2021 11:32 AM EDT ----- ?Hi Dr Salvadore Dom, ? ?This patient does not have a faculty appt on 5/18, she just has a lab appointment for her 3 hour sugar test. She will need to be called with an interpreter and sent in clotrimazole for her yeast infection. ? ?Thanks, ?Dr Miquel Dunn ?----- Message ----- ?From: Lavonda Jumbo, DO ?Sent: 08/18/2021  11:18 AM EDT ?To: Billey Co, MD, Shelby Mattocks, DO ? ?Quad screen normal. Positive for yeast infection. Has OB faculty follow up 5/18. Sending to resident and faculty on clinic staff that day. Please discuss at follow up and send in treatment at that time. She was symptomatic. 1hr gtt abnormal; 3hr gtt is scheduled.  ? ?Lavonda Jumbo, DO ?08/18/2021, 11:18 AM ?PGY-3, [facility] ? ? ?

## 2021-08-18 NOTE — Telephone Encounter (Signed)
Called patient (number given in the chart) with Dari interpreter [phone]. Her roommate answered. Given a different number [phone]. This number gave a voicemail box that is not yet set up. Unable to LVM.  ? ?Needs symptomatic treatment for yeast infection. Will send Rx in and attempt to call patient again tomorrow.  ? ?If she calls, please let her know to pick up this treatment.  ? ?Lavonda Jumbo, DO ?08/18/2021, 11:47 AM ?PGY-3, [facility] ? ?

## 2021-08-20 ENCOUNTER — Other Ambulatory Visit: Payer: No Typology Code available for payment source

## 2021-08-21 ENCOUNTER — Ambulatory Visit: Payer: Medicaid Other | Attending: Family Medicine

## 2021-08-21 DIAGNOSIS — Z3A21 21 weeks gestation of pregnancy: Secondary | ICD-10-CM | POA: Insufficient documentation

## 2021-08-21 DIAGNOSIS — O34219 Maternal care for unspecified type scar from previous cesarean delivery: Secondary | ICD-10-CM | POA: Diagnosis not present

## 2021-08-21 DIAGNOSIS — Z363 Encounter for antenatal screening for malformations: Secondary | ICD-10-CM | POA: Diagnosis present

## 2021-08-21 DIAGNOSIS — Z3689 Encounter for other specified antenatal screening: Secondary | ICD-10-CM | POA: Diagnosis not present

## 2021-08-21 DIAGNOSIS — O321XX0 Maternal care for breech presentation, not applicable or unspecified: Secondary | ICD-10-CM | POA: Insufficient documentation

## 2021-08-21 DIAGNOSIS — Z3401 Encounter for supervision of normal first pregnancy, first trimester: Secondary | ICD-10-CM

## 2021-08-24 ENCOUNTER — Other Ambulatory Visit (INDEPENDENT_AMBULATORY_CARE_PROVIDER_SITE_OTHER): Payer: Medicaid Other

## 2021-08-24 DIAGNOSIS — Z3402 Encounter for supervision of normal first pregnancy, second trimester: Secondary | ICD-10-CM | POA: Diagnosis present

## 2021-08-24 LAB — POCT CBG (FASTING - GLUCOSE)-MANUAL ENTRY: Glucose Fasting, POC: 85 mg/dL (ref 70–99)

## 2021-08-25 LAB — GESTATIONAL GLUCOSE TOLERANCE
Glucose, Fasting: 75 mg/dL (ref 70–94)
Glucose, GTT - 1 Hour: 171 mg/dL (ref 70–179)
Glucose, GTT - 2 Hour: 153 mg/dL (ref 70–154)
Glucose, GTT - 3 Hour: 70 mg/dL (ref 70–139)

## 2021-09-03 ENCOUNTER — Other Ambulatory Visit: Payer: Self-pay

## 2021-09-03 ENCOUNTER — Ambulatory Visit (INDEPENDENT_AMBULATORY_CARE_PROVIDER_SITE_OTHER): Payer: Medicaid Other | Admitting: Family Medicine

## 2021-09-03 VITALS — BP 109/69 | HR 74 | Temp 98.3°F | Wt 159.4 lb

## 2021-09-03 DIAGNOSIS — O09299 Supervision of pregnancy with other poor reproductive or obstetric history, unspecified trimester: Secondary | ICD-10-CM

## 2021-09-03 DIAGNOSIS — Z98891 History of uterine scar from previous surgery: Secondary | ICD-10-CM

## 2021-09-03 DIAGNOSIS — E038 Other specified hypothyroidism: Secondary | ICD-10-CM

## 2021-09-03 DIAGNOSIS — Z3201 Encounter for pregnancy test, result positive: Secondary | ICD-10-CM

## 2021-09-03 MED ORDER — PRENATAL 19 PO CHEW: 1.0000 | CHEWABLE_TABLET | Freq: Every day | ORAL | 3 refills | Status: DC

## 2021-09-03 MED ORDER — CLOTRIMAZOLE 3 2 % VA CREA: 1.0000 | TOPICAL_CREAM | Freq: Every day | VAGINAL | 0 refills | Status: AC

## 2021-09-03 MED ORDER — MAGNESIUM 400 MG PO TABS: 400.0000 mg | ORAL_TABLET | Freq: Every day | ORAL | 2 refills | Status: DC

## 2021-09-03 NOTE — Progress Notes (Unsigned)
  [place] Prenatal Visit  Katrina Brewer is a 31 y.o. G3P2002 at [redacted]w[redacted]d here for routine follow up. She is dated by early ultrasound @ [redacted]w[redacted]d.  She reports vaginal irritation, with symptoms of burning and irritation. She reports good fetal movement. No bleeding, loss of fluid, or contractions, but does have some abdominal pain. See flow sheet for details. Vitals:   09/03/21 1035 09/03/21 1037  BP: 109/69 109/69  Pulse: 74 74  Temp:  98.3 F (36.8 C)     A/P: Pregnancy at [redacted]w[redacted]d.  Doing well.   Dating reviewed, dating tab is correct Fetal heart tones Appropriate 138 Fundal height within expected range.  Anatomy ultrasound reviewed and normal.  Influenza vaccine not administered as not influenza season. Marland Kitchen  COVID vaccination was discussed and she is up to date.  Indications for screening for preexisting diabetes include: Reviewed indications for early 1 hour glucose testing, not indicated .  Pregnancy education provided on the following topics: fetal growth and movement, ultrasound assessment, and upcoming laboratory assessment.   The patient has the following indications for aspirin to begin 81 mg at 12-16 weeks: One high risk condition: no single high risk condition  MORE than one moderate risk condition: none Aspirin was not  recommended today based upon above risk factors (one high risk condition or more than one moderate risk factor)  Preterm labor precautions given.   Thyroid: Never heard that she has issues with thyroid Medicine: Not taking anything, took 3 months of PNV OB Hx: 1st child c-section, 2nd child vaginal delivery with forceps use OB Plans: Prefers vaginal delivery, epidural, no female doctor at birth unless emergency, circ, nexplanon Peds Provider: Here at [facility] Feeding: BF Abd Pain: Hurts more when she stands up, located in groin area Depression: Negative for feeling down/depressed, but reports some difficulty sleeping Bilat Leg Pain: Burning during  day/night, been an issues for years, knees and below Pain: Pain allover, headache, pain down to shoulders, does not affect belly, when she eats meat or beans   2. Pregnancy issues include the following and were addressed as appropriate today:  H/o of C-section desires TOLAC -Referal at 32 wk to discuss tolac with OB.  Yeast Infection Continues to have symptoms of burning and irritation. Yeast infection noted on wet prep. Not yet treated. -Clotrimazole cream, intravaginal, 7 days  Leg Burning Complains of Bilateral leg burning sensation, present for years. Will attempt to treat with Mg. Symptoms present at night and during day, low concern for RLS. Patient Hgb 11.5 most recently, making anemia less likely as cause for symptoms. Patient has passed 3 hr GTT, making GDM with neuropathy less likely as cause for symptoms. -Mg 400 mg daily  Subclinical Hypothyroidism Patient reports no history of Thyroid problems, but is noted on problem list in chart. Will check TSH today. -TSH w T4 reflex  Round Ligament Pain Patient reports pain in groin area, worse with standing. Symptoms likely 2/2 round ligament   No Female Providers at [facility]!!    Follow up 4 weeks: needs 2 hr GTT, CBC, RPR, HIV, TDAP vax at next visit.

## 2021-09-03 NOTE — Progress Notes (Deleted)
  [facility] Prenatal Visit  Katrina Brewer is a 31 y.o. G3P2002 at [redacted]w[redacted]d here for routine follow up. She is dated by early ultrasound @ [redacted]w[redacted]d.  She reports {symptoms; pregnancy related:14538}.  She reports good fetal movement. No bleeding, loss of fluid, contractions. See flow sheet for details. Vitals:   09/03/21 1035  BP: 109/69  Pulse: 74     A/P: Pregnancy at [redacted]w[redacted]d.  Doing well.   Dating reviewed, dating tab is {correct:23336::"correct"} Fetal heart tones {appropriate:23337} Fundal height {fundal height:23342::"within expected range. "} Anatomy ultrasound reviewed and notable for ***.  Influenza vaccine {given:23340}.  COVID vaccination was discussed and ***.  Indications for screening for preexisting diabetes include: {Pre-existing diabetes screening:23343::"Reviewed indications for early 1 hour glucose testing, not indicated "}.  Pregnancy education provided on the following topics: fetal growth and movement, ultrasound assessment, and upcoming laboratory assessment.   The patient has the following indications for aspirinto begin 81 mg at 12-16 weeks: One high risk condition: {fmcaspirinobhigh:26167} MORE than one moderate risk condition: {fmcaspirinobmoderate:26168} Aspirin {WAS/WAS NOT:[phone]::"was not"}  recommended today based upon above risk factors (one high risk condition or more than one moderate risk factor)  [facility]uled for [facility] during second trimester on ***. Preterm labor precautions given.   2. Pregnancy issues include the following and were addressed as appropriate today:   ***  Problem list and pregnancy box updated: {yes/no:20286::"Yes"}.   [employer]      Follow up 4 weeks: needs 2 hr GTT, CBC, RPR, HIV, TDAP vax at next visit.

## 2021-09-03 NOTE — Patient Instructions (Signed)
Please make a follow up appointment in 4 weeks.  Pregnancy Related Return Precautions The follow are signs/symptoms that are abnormal in pregnancy and may require further evaluation by a physician: Go to the MA[facility] at [facility] [place] if: You have cramping/contractions that do not go away with drinking water, especially if they are lasting 30 seconds to 1.5 minutes, coming and going every 5-10 minutes for an hour or more, or are getting stronger and you cannot walk or talk while having a contraction/cramp. Your water breaks.  Sometimes it is a big gush of fluid, sometimes it is just a trickle that keeps getting your underwear wet or running down your legs You have vaginal bleeding.    You do not feel your baby moving like normal.  If you do not, get something to eat and drink (something cold or something with sugar like peanut butter or juice) and lay down and focus on feeling your baby move. If your baby is still not moving like normal, you should go to MAU. You should feel your baby move 6 times in one hour, or 10 times in two hours. You have a persistent headache that does not go away with 1 g of Tylenol, vision changes, chest pain, difficulty breathing, severe pain in your right upper abdomen, worsening leg swelling- these can all be signs of high blood pressure in pregnancy and need to be evaluated by a provider immediately  These are all concerning in pregnancy and if you have any of these I recommend you call your PCP and present to the Maternity Admissions Unit (map below) for further evaluation.  For any pregnancy-related emergencies, please go to the Maternity Admissions Unit in the [facility]. You will use hospital Entrance C.    Our clinic number is [phone].   Dr Pray  

## 2021-09-04 ENCOUNTER — Encounter: Payer: Self-pay | Admitting: Family Medicine

## 2021-09-04 DIAGNOSIS — O09299 Supervision of pregnancy with other poor reproductive or obstetric history, unspecified trimester: Secondary | ICD-10-CM | POA: Insufficient documentation

## 2021-09-04 DIAGNOSIS — Z98891 History of uterine scar from previous surgery: Secondary | ICD-10-CM | POA: Insufficient documentation

## 2021-09-04 LAB — TSH RFX ON ABNORMAL TO FREE T4: TSH: 0.592 u[IU]/mL (ref 0.450–4.500)

## 2021-10-09 ENCOUNTER — Ambulatory Visit (INDEPENDENT_AMBULATORY_CARE_PROVIDER_SITE_OTHER): Payer: Medicaid Other | Admitting: Family Medicine

## 2021-10-09 VITALS — BP 98/62 | HR 91 | Wt 163.0 lb

## 2021-10-09 DIAGNOSIS — Z3A28 28 weeks gestation of pregnancy: Secondary | ICD-10-CM

## 2021-10-09 DIAGNOSIS — Z3201 Encounter for pregnancy test, result positive: Secondary | ICD-10-CM

## 2021-10-09 DIAGNOSIS — Z3401 Encounter for supervision of normal first pregnancy, first trimester: Secondary | ICD-10-CM

## 2021-10-09 DIAGNOSIS — Z23 Encounter for immunization: Secondary | ICD-10-CM | POA: Diagnosis not present

## 2021-10-09 LAB — POCT 1 HR PRENATAL GLUCOSE: Glucose 1 Hr Prenatal, POC: 181 mg/dL

## 2021-10-09 MED ORDER — PRENATAL 19 PO CHEW: 1.0000 | CHEWABLE_TABLET | Freq: Every day | ORAL | 3 refills | Status: DC

## 2021-10-09 NOTE — Progress Notes (Signed)
  [facility] Prenatal Visit  Katrina Brewer is a 31 y.o. G3P2002 at [redacted]w[redacted]d here for routine follow up. She is dated by early ultrasound.  She reports no complaints. She reports fetal movement. She denies vaginal bleeding, contractions, or loss of fluid. See flow sheet for details.  Vitals:   10/09/21 0828  BP: 98/62  Pulse: 91      A/P: Pregnancy at [redacted]w[redacted]d.  Doing well.   Routine prenatal care:  Dating reviewed, dating tab is correct. Fetal heart tones 135 bpm. Fundal height 28 cm Infant feeding choice: Breastfeeding  Contraception choice: undecided  Infant circumcision desired yes  The patient does have a history of C-section, OB/gyn referral placed for consent of TOLAC given patient's preference to have trial vaginal delivery.  Influenza vaccine was not given as it is not flu season.  Tdap was given today. 1 hour glucola, CBC, RPR, and HIV were obtained today.    Rh status was reviewed and patient does not need Rhogam.  Rhogam was not given today.  Pregnancy medical home and PHQ-9 forms were done today and reviewed.   Childbirth and education classes were offered. Pregnancy education regarding benefits of breastfeeding, contraception, fetal growth, expected weight gain, and safe infant sleep were discussed.  Preterm labor and fetal movement precautions reviewed.  2. Pregnancy issues include the following and were addressed as appropriate today:   Patient desires TOLAC, referral placed to OB/gyn for consent.   Problem list and pregnancy box updated: Yes.   Patient will need to be scheduled in [place] during third trimester. Patient will be following up next week for 3 hour glucola as 1 hour glucola was elevated today at 181. Next OB follow up in 2 weeks.   In-person D[place] interpretation [employer]) utilized throughout the entirety of this encounter.

## 2021-10-09 NOTE — Patient Instructions (Addendum)
It was great seeing you today!  I am glad that your pregnancy is going well!  We will also get some labs today as we discussed, I will let you know of any abnormal results from the blood work.   Please make sure to take your prenatal vitamin every day, I have sent in refills of this to your pharmacy.  Go to the MAU at [facility] if: You have cramping/contractions that do not go away with drinking water Your water breaks.  Sometimes it is a big gush of fluid, sometimes it is just a trickle that keeps getting your underwear wet or running down your legs You have vaginal bleeding.    You do not feel your baby moving like normal.  If you do not, get something to eat and drink and lay down and focus on feeling your baby move. If your baby is still not moving like normal, you should go to MAU.   Please follow up at your next scheduled appointment in 2 weeks, if anything arises between now and then, please don't hesitate to contact our office.   Thank you for allowing [place] to be a part of your medical care!  Thank you, Dr. Robyne Peers

## 2021-10-13 ENCOUNTER — Other Ambulatory Visit (INDEPENDENT_AMBULATORY_CARE_PROVIDER_SITE_OTHER): Payer: Medicaid Other

## 2021-10-13 DIAGNOSIS — Z3A28 28 weeks gestation of pregnancy: Secondary | ICD-10-CM | POA: Diagnosis not present

## 2021-10-13 DIAGNOSIS — Z3493 Encounter for supervision of normal pregnancy, unspecified, third trimester: Secondary | ICD-10-CM | POA: Diagnosis not present

## 2021-10-13 LAB — POCT CBG (FASTING - GLUCOSE)-MANUAL ENTRY: Glucose Fasting, POC: 80 mg/dL (ref 70–99)

## 2021-10-14 LAB — CBC
Hematocrit: 34.6 % (ref 34.0–46.6)
Hemoglobin: 10.9 g/dL — ABNORMAL LOW (ref 11.1–15.9)
MCH: 26.4 pg — ABNORMAL LOW (ref 26.6–33.0)
MCHC: 31.5 g/dL (ref 31.5–35.7)
MCV: 84 fL (ref 79–97)
Platelets: 310 10*3/uL (ref 150–450)
RBC: 4.13 x10E6/uL (ref 3.77–5.28)
RDW: 14.7 % (ref 11.7–15.4)
WBC: 9.8 10*3/uL (ref 3.4–10.8)

## 2021-10-14 LAB — HIV ANTIBODY (ROUTINE TESTING W REFLEX): HIV Screen 4th Generation wRfx: NONREACTIVE

## 2021-10-14 LAB — GESTATIONAL GLUCOSE TOLERANCE
Glucose, Fasting: 67 mg/dL — ABNORMAL LOW (ref 70–94)
Glucose, GTT - 1 Hour: 169 mg/dL (ref 70–179)
Glucose, GTT - 2 Hour: 141 mg/dL (ref 70–154)
Glucose, GTT - 3 Hour: 112 mg/dL (ref 70–139)

## 2021-10-14 LAB — T PALLIDUM ANTIBODY, EIA: T pallidum Antibody, EIA: NEGATIVE

## 2021-10-14 LAB — RPR W/REFLEX TO TREPSURE: RPR: NONREACTIVE

## 2021-10-16 ENCOUNTER — Other Ambulatory Visit: Payer: Self-pay | Admitting: Family Medicine

## 2021-10-16 DIAGNOSIS — O99019 Anemia complicating pregnancy, unspecified trimester: Secondary | ICD-10-CM

## 2021-10-16 MED ORDER — FERROUS SULFATE 325 (65 FE) MG PO TABS: 325.0000 mg | ORAL_TABLET | ORAL | 1 refills | Status: DC

## 2021-10-27 NOTE — Progress Notes (Unsigned)
  [facility] Prenatal Visit  Katrina Brewer is a 31 y.o. G3P2002 at [redacted]w[redacted]d here for routine follow up. She is dated by {Ob dating:14516}.  She reports {symptoms; pregnancy related:14538}. She reports fetal movement. She denies vaginal bleeding, contractions, or loss of fluid. See flow sheet for details.  There were no vitals filed for this visit.    A/P: Pregnancy at [redacted]w[redacted]d.  Doing well.   Routine prenatal care:  Dating reviewed, dating tab is {correct:23336::"correct"} Fetal heart tones {appropriate:23337} Fundal height {fundal height:23342::"within expected range. "} Infant feeding choice: Breastfeeding Contraception choice: {postpartum contraception:23348} Infant circumcision desired yes  The patient has a history of Cesarean delivery and consult to [facility] placed for discussion given patient request to trial TOLAC Influenza vaccine not administered as not influenza season.   Tdap was given at 10/09/21 visit.  1 hour glucola, CBC, RPR, and HIV obtained on last visit, anemia of pregnancy noted and iron sent to pharmacy   Rh status was reviewed and patient does not need Rhogam.  Rhogam was not given today.  Pregnancy medical home and PHQ-9 forms were done today and reviewed.   Childbirth and education classes {WERE / WERE VQQ:59563} offered. Pregnancy education regarding benefits of breastfeeding, contraception, fetal growth, expected weight gain, and safe infant sleep were discussed.  Preterm labor and fetal movement precautions reviewed.  2. Pregnancy issues include the following and were addressed as appropriate today:   Desires TOLAC: referral was placed for OB on last visit   Problem list and pregnancy box updated: {yes/no:20286::"Yes"}.   Patient scheduled in [facility] during third trimester on ***.  Follow up 2 weeks.

## 2021-10-27 NOTE — Patient Instructions (Incomplete)
It was great to see you today! Thank you for choosing [facility] for your obstetric care. Katrina Brewer was seen for ob visit   Baby continues to grow, which is great We have discussed checking for gestational diabetes and blood pressure monitoring -- all of which have been reassuring  Please continue with seeing OB here in [place] to discuss vaginal delivery as an option  We will continue with iron that is at your pharmacy; take it every other day   [facility] call to schedule appt to discuss vaginal delivery  Address: [address] [place], [place] [address] Phone: [phone]  Go to the MAU at [facility] if: You have cramping/contractions that do not go away with drinking water Your water breaks.  Sometimes it is a big gush of fluid, sometimes it is just a trickle that keeps getting your underwear wet or running down your legs You have vaginal bleeding.    You do not feel your baby moving like normal.  If you do not, get something to eat and drink and lay down and focus on feeling your baby move. If your baby is still not moving like normal, you should go to MAU.   If you haven't already, sign up for My Chart to have easy access to your labs results, and communication with your primary care physician.  You should return to our clinic 2 weeks for OB follow up  I recommend that you always bring your medications to each appointment as this makes it easy to ensure you are on the correct medications and helps [place] not miss refills when you need them.  Please arrive 15 minutes before your appointment to ensure smooth check in process.  We appreciate your efforts in making this happen.  Please call the clinic at [phone] if your symptoms worsen or you have any concerns.  Thank you for allowing me to participate in your care, Trampus Mcquerry [employer]

## 2021-10-28 ENCOUNTER — Other Ambulatory Visit: Payer: Self-pay

## 2021-10-28 ENCOUNTER — Ambulatory Visit (INDEPENDENT_AMBULATORY_CARE_PROVIDER_SITE_OTHER): Payer: Medicaid Other | Admitting: Student

## 2021-10-28 ENCOUNTER — Encounter: Payer: Self-pay | Admitting: Student

## 2021-10-28 VITALS — BP 105/70 | HR 84 | Wt 165.6 lb

## 2021-10-28 DIAGNOSIS — Z3401 Encounter for supervision of normal first pregnancy, first trimester: Secondary | ICD-10-CM

## 2021-10-28 DIAGNOSIS — Z3A30 30 weeks gestation of pregnancy: Secondary | ICD-10-CM

## 2021-10-28 MED ORDER — PRENATAL GUMMIES 0.18-25 MG PO CHEW: 1.0000 | CHEWABLE_TABLET | Freq: Every day | ORAL | 0 refills | Status: DC

## 2021-11-10 NOTE — Progress Notes (Unsigned)
  [facility] Prenatal Visit  Katrina Brewer is a 31 y.o. G3P2002 at [redacted]w[redacted]d here for routine follow up. She is dated by early ultrasound.  She reports {symptoms; pregnancy related:14538}.  She reports fetal movement. She denies vaginal bleeding, contractions, or loss of fluid.  See flow sheet for details. Interpreter used during visit***  There were no vitals filed for this visit.   A/P: Pregnancy at [redacted]w[redacted]d.  Doing well.   Routine prenatal care:  Dating reviewed, dating tab is {correct:23336::"correct"} Fetal heart tones: {appropriate:23337} Fundal height: {fundal height:23342::"within expected range. "} The patient {does/does not:200015::"does not"} have a history of HSV and valacyclovir {ACTION; IS/IS NOT:[id number]::"is not"} indicated at this time.  The patient {CWHCS:23409::"does not have a history of Cesarean delivery and no referral to [facility] is indicated"} Infant feeding choice: Breastfeeding Contraception choice: {postpartum contraception:23348} Infant circumcision desired yes Influenza vaccine {given:23340}  Tdap was not given today. COVID vaccination was discussed and ***.  Childbirth and education classes {WERE / WERE EYC:14481} offered. Pregnancy education regarding benefits of breastfeeding, contraception, fetal growth, expected weight gain, and safe infant sleep were discussed.  Preterm labor and fetal movement precautions reviewed.   2. Pregnancy issues include the following and were addressed as appropriate today:  ***  Problem list and pregnancy box updated: {yes/no:20286::"Yes"}.   Scheduled for [facility] in third trimester on ***.   Follow up 2 weeks.

## 2021-11-11 ENCOUNTER — Ambulatory Visit (INDEPENDENT_AMBULATORY_CARE_PROVIDER_SITE_OTHER): Payer: Medicaid Other | Admitting: Student

## 2021-11-11 VITALS — BP 97/74 | HR 84 | Wt 168.5 lb

## 2021-11-11 DIAGNOSIS — Z3483 Encounter for supervision of other normal pregnancy, third trimester: Secondary | ICD-10-CM | POA: Diagnosis present

## 2021-11-11 DIAGNOSIS — Z3A32 32 weeks gestation of pregnancy: Secondary | ICD-10-CM | POA: Diagnosis not present

## 2021-11-11 MED ORDER — PRENATAL MULTIVITAMIN CH: 1.0000 | ORAL_TABLET | Freq: Every day | ORAL | 0 refills | Status: AC

## 2021-11-11 NOTE — Patient Instructions (Addendum)
It was great seeing you today!   I am glad that your pregnancy is going well!   Please make sure to take your prenatal vitamin every day. I have refilled this   Go to the MAU at [facility] if: You have cramping/contractions that do not go away with drinking water Your water breaks.  Sometimes it is a big gush of fluid, sometimes it is just a trickle that keeps getting your underwear wet or running down your legs You have vaginal bleeding.    You do not feel your baby moving like normal.  If you do not, get something to eat and drink and lay down and focus on feeling your baby move. If your baby is still not moving like normal, you should go to MAU.    Please follow up at your next scheduled appointment in 2 weeks, if anything arises between now and then, please don't hesitate to contact our office.    Thank you for allowing [place] to be a part of your medical care!   Thank you,  Dr. Laroy Apple

## 2021-11-25 ENCOUNTER — Ambulatory Visit (HOSPITAL_COMMUNITY)
Admission: EM | Admit: 2021-11-25 | Discharge: 2021-11-25 | Disposition: A | Discharge status: Home / Self Care | Payer: Medicaid Other | Attending: Family Medicine | Admitting: Family Medicine

## 2021-11-25 ENCOUNTER — Encounter (HOSPITAL_COMMUNITY): Payer: Self-pay | Admitting: *Deleted

## 2021-11-25 DIAGNOSIS — U071 COVID-19: Secondary | ICD-10-CM | POA: Diagnosis not present

## 2021-11-25 DIAGNOSIS — J069 Acute upper respiratory infection, unspecified: Secondary | ICD-10-CM | POA: Diagnosis not present

## 2021-11-25 LAB — POC INFLUENZA A AND B ANTIGEN (URGENT CARE ONLY)
INFLUENZA A ANTIGEN, POC: NEGATIVE
INFLUENZA B ANTIGEN, POC: NEGATIVE

## 2021-11-25 NOTE — ED Provider Notes (Signed)
[facility]    CSN: [id number] Arrival date & time: 11/25/21  7096      History   Chief Complaint Chief Complaint  Patient presents with   Fever   Cough   Nasal Congestion    HPI Katrina Brewer is a 31 y.o. female.    Fever Associated symptoms: cough   Cough Associated symptoms: fever    Here with cough, sore throat, and nasal congestion that began on August 21.  She is not sure she has had any fever, but she has had some chills.  No nausea or vomiting or diarrhea.  She is about [redacted] weeks pregnant.  She has taken a couple of days worth of Augmentin which she was prescribed back in March here for a sinus infection.  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   Subclinical hyperthyroidism 08/15/2020   Elevated LDL cholesterol level 08/15/2020   Positive QuantiFERON-TB Gold test 08/07/2020   Refugee health examination 07/29/2020   TB lung, 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 Authorizing Provider  ferrous sulfate (FERROUSUL) 325 (65 FE) MG tablet Take 1 tablet (325 mg total) by mouth every other day. 10/16/21 04/14/22 Yes Ganta, Anupa, DO   Prenatal Vit-Fe Fumarate-FA (PRENATAL MULTIVITAMIN) TABS tablet Take 1 tablet by mouth daily at 12 noon. 11/11/21 12/11/21 Yes Levin Erp, MD  Doxylamine-Pyridoxine (DICLEGIS) 10-10 MG TBEC Take 1 tablet by mouth in the morning and at night for vomiting Patient not taking: Reported on 10/09/2021 05/22/21   Westley Chandler, MD  Magnesium 400 MG TABS Take 400 mg by mouth daily. Patient not taking: Reported on 10/09/2021 09/03/21   Bess Kinds, MD    Family History History reviewed. No pertinent family history.  Social History Social History   Tobacco Use   Smoking status: Never   Smokeless tobacco: Never  Vaping Use   Vaping Use: Never used  Substance Use Topics   Alcohol use: Never   Drug use: Never     Allergies   Patient has no known allergies.   Review of Systems Review of Systems  Constitutional:  Positive for fever.  Respiratory:  Positive for cough.      Physical Exam Triage Vital Signs ED Triage Vitals  Enc Vitals Group     BP 11/25/21 1932 116/81  Pulse Rate 11/25/21 1932 (!) 125     Resp 11/25/21 1932 18     Temp 11/25/21 1932 98.7 F (37.1 C)     Temp Source 11/25/21 1932 Oral     SpO2 11/25/21 1932 95 %     Weight --      Height --      Head Circumference --      Peak Flow --      Pain Score 11/25/21 1929 5     Pain Loc --      Pain Edu? --      Excl. in [place]? --    No data found.  Updated Vital Signs BP 116/81 (BP Location: Left Arm)   Pulse (!) 125   Temp 98.7 F (37.1 C) (Oral)   Resp 18   LMP 01/22/2021   SpO2 95%   Visual Acuity Right Eye Distance:   Left Eye Distance:   Bilateral Distance:    Right Eye Near:   Left Eye Near:    Bilateral Near:     Physical Exam Vitals reviewed.  Constitutional:      General: She is not in acute distress.    Appearance: She is not ill-appearing, toxic-appearing or diaphoretic.  HENT:     Right Ear: Tympanic membrane and ear canal normal.     Left Ear: Tympanic membrane and ear canal  normal.     Nose: Congestion present.     Mouth/Throat:     Mouth: Mucous membranes are moist.     Pharynx: No oropharyngeal exudate or posterior oropharyngeal erythema.  Eyes:     Extraocular Movements: Extraocular movements intact.     Conjunctiva/sclera: Conjunctivae normal.     Pupils: Pupils are equal, round, and reactive to light.  Cardiovascular:     Rate and Rhythm: Regular rhythm. Tachycardia present.     Heart sounds: No murmur heard.    Comments: Heart rate about 110 when I am in the room with her. Pulmonary:     Effort: Pulmonary effort is normal. No respiratory distress.     Breath sounds: No stridor. No wheezing, rhonchi or rales.  Musculoskeletal:     Cervical back: Neck supple.  Lymphadenopathy:     Cervical: No cervical adenopathy.  Skin:    Coloration: Skin is not jaundiced or pale.  Neurological:     General: No focal deficit present.     Mental Status: She is alert and oriented to person, place, and time.  Psychiatric:        Behavior: Behavior normal.      UC Treatments / Results  Labs (all labs ordered are listed, but only abnormal results are displayed) Labs Reviewed  SARS CORONAVIRUS 2 (TAT 6-24 HRS)  POC INFLUENZA A AND B ANTIGEN (URGENT CARE ONLY)    EKG   Radiology No results found.  Procedures Procedures (including critical care time)  Medications Ordered in UC Medications - No data to display  Initial Impression / Assessment and Plan / UC Course  I have reviewed the triage vital signs and the nursing notes.  Pertinent labs & imaging results that were available during my care of the patient were reviewed by me and considered in my medical decision making (see chart for details).     Flu test is negative.  COVID swab was done here.  If positive she will need to contact her GYN to see if they think she should take oral antivirals. Final Clinical Impressions(s) / UC Diagnoses  Final diagnoses:  Viral URI with cough      Discharge Instructions      Your flu test was negative.   You have been swabbed for COVID, and the test will result in the next 24 hours. Our staff will call you if positive. If the test is positive, you should quarantine for 5 days from the start of your symptoms  You can take Tylenol as needed for aches or pain. You can take sudafed as needed for congestion. Drink plenty of fluids      ED Prescriptions   None    PDMP not reviewed this encounter.   Katrina Resides, MD 11/25/21 2014

## 2021-11-25 NOTE — Discharge Instructions (Addendum)
Your flu test was negative.   You have been swabbed for COVID, and the test will result in the next 24 hours. Our staff will call you if positive. If the test is positive, you should quarantine for 5 days from the start of your symptoms  You can take Tylenol as needed for aches or pain. You can take sudafed as needed for congestion. Drink plenty of fluids

## 2021-11-25 NOTE — ED Triage Notes (Signed)
Patient states through translator that she has a cough, congestion, fever for 3 days.  She is having headache and dizzy. She is on Augmentin from march 2023 visit she she has been taking it for 2 days.

## 2021-11-26 ENCOUNTER — Telehealth: Payer: Self-pay | Admitting: Family Medicine

## 2021-11-26 LAB — SARS CORONAVIRUS 2 (TAT 6-24 HRS): SARS Coronavirus 2: POSITIVE — AB

## 2021-11-26 NOTE — Telephone Encounter (Signed)
Called patient to discuss result of COVID swab from urgent care yesterday, which is positive. Dari interpreter utilized. Of note patient's phone # is [phone], will update in her record.  Patient reports she has runny nose and congestion, otherwise no breathing issues. Symptoms began 8/21. She asked about medication. Discussed risks/benefits of using paxlovid with her. After discussion she opted not to take the medication. She will do supportive care at home.  Denies cramping/ctx, fluid leaking, vaginal bleeding, or decreased fetal movement.    Inquired if she feels she needs to be seen today for her faculty OB clinic appointment, or if she is comfortable rescheduling til after she is outside of the window of being contagious. She is fine with rescheduling.  Visit rescheduled for Friday 9/1 in the afternoon with Dr. Larita Fife. Dr. Miquel Dunn will be precepting that afternoon - this will need to serve as her third trimester faculty OB clinic visit. Patient will need GBS and gc/chlamydia obtained at that visit, and fetal presentation confirmed.  She has an appointment on 9/7 with Dr. Shawnie Pons to discuss TOLAC vs RCS.   Discussed MAU precautions with her, and quarantine recommendations - should remain home through at least tomorrow, and then wear a mask any time she goes into public until a week from today. Patient appreciative and agreeable. Encouraged her to call us back if she has any questions or concerns.  Latrelle Dodrill, MD

## 2021-12-04 ENCOUNTER — Ambulatory Visit (INDEPENDENT_AMBULATORY_CARE_PROVIDER_SITE_OTHER): Payer: Medicaid Other | Admitting: Family Medicine

## 2021-12-04 ENCOUNTER — Other Ambulatory Visit (HOSPITAL_COMMUNITY)
Admission: RE | Admit: 2021-12-04 | Discharge: 2021-12-04 | Disposition: A | Discharge status: Home / Self Care | Payer: Medicaid Other | Source: Ambulatory Visit | Attending: Family Medicine | Admitting: Family Medicine

## 2021-12-04 VITALS — BP 120/78 | HR 92 | Wt 172.2 lb

## 2021-12-04 DIAGNOSIS — Z3483 Encounter for supervision of other normal pregnancy, third trimester: Secondary | ICD-10-CM | POA: Insufficient documentation

## 2021-12-04 DIAGNOSIS — B3789 Other sites of candidiasis: Secondary | ICD-10-CM

## 2021-12-04 MED ORDER — NYSTATIN 100000 UNIT/GM EX CREA: 1.0000 | TOPICAL_CREAM | Freq: Two times a day (BID) | CUTANEOUS | 1 refills | Status: DC

## 2021-12-04 NOTE — Patient Instructions (Signed)
I have translated the following text using Google translate.  As such, there are many errors.  I apologize for the poor written translation; however, we do not have written  translation services yet. It was wonderful to see you today. Thank you for allowing me to be a part of your care. Below is a short summary of what we discussed at your visit today:  Pregnancy Continue taking your prenatal vitamin every day.  Continue taking your iron supplement every day.  If it makes you constipated, reduce to every other day.  Today we collected a vaginal swab for bacteria and infections.  We do this for every pregnant woman at 36 weeks of pregnancy.  At this point in your pregnancy, you will need appointments every week until you deliver.  We booked your appointments, details on the next page.  If these days and times do not work well for you, please reschedule at the front desk.  Emergency planning If you experience any vaginal bleeding, leakage of fluids, don't feel your baby moving as much, or start to have contractions less than 5 minutes apart please go directly to the Maternal Assessment Unit at [facility] for evaluation.  Maternity and women's care services located on the [place] side of [facility] [place]. [facility] (Entrance C off [address]).  [address] [place],  [place]  [address]      If you have any questions or concerns, please do not hesitate to contact us via phone or MyChart message.   Fayette Pho, MD

## 2021-12-04 NOTE — Progress Notes (Signed)
  [facility] Prenatal Visit  Katrina Brewer is a 31 y.o. G3P2002 at [redacted]w[redacted]d here for routine follow up. She is dated by early ultrasound.  She reports backache and occasional contractions. She reports fetal movement. She denies vaginal bleeding, contractions, or loss of fluid. See flow sheet for details.  Vitals:   12/04/21 1417  BP: 120/78  Pulse: 92   Under breast rash - present for two weeks - itchy, red  A/P: Pregnancy at [redacted]w[redacted]d.  Doing well.   Routine prenatal care  Dating reviewed, dating tab is correct Fetal heart tones Appropriate - 130-140 Fundal height within expected range.  - 34 cm Fetal position confirmed Vertex using Ultrasound .  GBS collected today. .  Repeat GC/CT collected today.  The patient does not have a history of HSV and valacyclovir is not indicated at this time.  Infant feeding choice: Breastfeeding Contraception choice: Natural family planning Infant circumcision desired yes Influenza vaccine not administered as not influenza season.   Tdap previously administered between 27-36 weeks  10/09/2021 Pregnancy education regarding preterm labor, fetal movement,  benefits of breastfeeding, contraception, fetal growth, expected weight gain, and safe infant sleep were discussed.   2. Pregnancy issues include the following and were addressed as appropriate today:  Problem list and pregnancy box updated: Yes.   Yeast infection under breast: Rx nystatin cream per patient preference GBS and GC/CH collected today Scheduled for TOLAC discussion 9/7 Follow up 1 week.  Appointments for remainder of pregnancy scheduled.   Fayette Pho, MD

## 2021-12-08 ENCOUNTER — Encounter: Payer: Self-pay | Admitting: Family Medicine

## 2021-12-08 LAB — CERVICOVAGINAL ANCILLARY ONLY
Chlamydia: NEGATIVE
Comment: NEGATIVE
Comment: NORMAL
Neisseria Gonorrhea: NEGATIVE

## 2021-12-08 LAB — CULTURE, BETA STREP (GROUP B ONLY): Strep Gp B Culture: NEGATIVE

## 2021-12-08 NOTE — Progress Notes (Signed)
Letter for GBS negative result. Will send to interpreter services for professional translation.  Fayette Pho, MD

## 2021-12-10 ENCOUNTER — Encounter: Payer: Self-pay | Admitting: Family Medicine

## 2021-12-10 ENCOUNTER — Other Ambulatory Visit: Payer: Self-pay

## 2021-12-10 ENCOUNTER — Ambulatory Visit (INDEPENDENT_AMBULATORY_CARE_PROVIDER_SITE_OTHER): Payer: Medicaid Other | Admitting: Family Medicine

## 2021-12-10 VITALS — BP 109/71 | HR 88 | Wt 173.4 lb

## 2021-12-10 DIAGNOSIS — Z23 Encounter for immunization: Secondary | ICD-10-CM

## 2021-12-10 DIAGNOSIS — O09293 Supervision of pregnancy with other poor reproductive or obstetric history, third trimester: Secondary | ICD-10-CM | POA: Diagnosis not present

## 2021-12-10 DIAGNOSIS — Z3A37 37 weeks gestation of pregnancy: Secondary | ICD-10-CM

## 2021-12-10 DIAGNOSIS — Z98891 History of uterine scar from previous surgery: Secondary | ICD-10-CM | POA: Diagnosis not present

## 2021-12-10 DIAGNOSIS — O09299 Supervision of pregnancy with other poor reproductive or obstetric history, unspecified trimester: Secondary | ICD-10-CM

## 2021-12-10 DIAGNOSIS — Z3483 Encounter for supervision of other normal pregnancy, third trimester: Secondary | ICD-10-CM

## 2021-12-10 NOTE — Progress Notes (Signed)
Faculty Practice OB/GYN Attending Consult Note  31 y.o. [id number] at [redacted]w[redacted]d with Estimated Date of Delivery: 12/31/21 was seen today in office to discuss trial of labor after cesarean section (TOLAC) versus elective repeat cesarean delivery (ERCD). The following risks were discussed with the patient.  Risk of uterine rupture at term is 0.78 percent with TOLAC and 0.22 percent with ERCD. 1 in 10 uterine ruptures will result in neonatal death or neurological injury. The benefits of a trial of labor after cesarean (TOLAC) resulting in a vaginal birth after cesarean (VBAC) include the following: shorter length of hospital stay and postpartum recovery (in most cases); fewer complications, such as postpartum fever, wound or uterine infection, thromboembolism (blood clots in the leg or lung), need for blood transfusion and fewer neonatal breathing problems. The risks of an attempted VBAC or TOLAC include the following: Risk of failed trial of labor after cesarean (TOLAC) without a vaginal birth after cesarean (VBAC) resulting in repeat cesarean delivery (RCD) in about 20 to 40 percent of women who attempt VBAC.  Risk of rupture of uterus resulting in an emergency cesarean delivery. The risk of uterine rupture may be related in part to the type of uterine incision made during the first cesarean delivery. A previous transverse uterine incision has the lowest risk of rupture (0.2 to 1.5 percent risk). Vertical or T-shaped uterine incisions have a higher risk of uterine rupture (4 to 9 percent risk)The risk of fetal death is very low with both VBAC and elective repeat cesarean delivery (ERCD), but the likelihood of fetal death is higher with VBAC than with ERCD. Maternal death is very rare with either type of delivery. The risks of an elective repeat cesarean delivery (ERCD) were reviewed with the patient including but not limited to: 05/998 risk of uterine rupture which could have serious consequences, bleeding which may  require transfusion; infection which may require antibiotics; injury to bowel, bladder or other surrounding organs (bowel, bladder, ureters); injury to the fetus; need for additional procedures including hysterectomy in the event of a life-threatening hemorrhage; thromboembolic phenomenon; abnormal placentation; incisional problems; death and other postoperative or anesthesia complications.    These risks and benefits are summarized on the consent form, which was reviewed with the patient during the visit.  All her questions answered and she signed a consent indicating a preference for TOLAC. A copy of the consent was given to the patient.  Dari interpreter: Mahin used  Will continue routine care with [facility].  Reva Bores, MD 12/10/2021 3:22 PM

## 2021-12-15 ENCOUNTER — Other Ambulatory Visit: Payer: Self-pay

## 2021-12-15 ENCOUNTER — Ambulatory Visit (INDEPENDENT_AMBULATORY_CARE_PROVIDER_SITE_OTHER): Payer: Medicaid Other | Admitting: Family Medicine

## 2021-12-15 VITALS — BP 121/77 | HR 85 | Wt 174.6 lb

## 2021-12-15 DIAGNOSIS — Z3483 Encounter for supervision of other normal pregnancy, third trimester: Secondary | ICD-10-CM

## 2021-12-15 NOTE — Patient Instructions (Addendum)
It was nice seeing you today!  Go to the Maternity Assessment Unit at [facility] if you develop any vaginal bleeding, contractions, or leakage of fluid.  Stay well, Littie Deeds, MD [facility] [phone]  --  Make sure to check out at the front desk before you leave today.  Please arrive at least 15 minutes prior to your scheduled appointments.  If you had blood work today, I will send you a MyChart message or a letter if results are normal. Otherwise, I will give you a call.  If you had a referral placed, they will call you to set up an appointment. Please give [place] a call if you don't hear back in the next 2 weeks.  If you need additional refills before your next appointment, please call your pharmacy first.

## 2021-12-15 NOTE — Progress Notes (Signed)
  [facility] Prenatal Visit  Katrina Brewer is a 31 y.o. G3P2002 at [redacted]w[redacted]d here for routine follow up. She is dated by early ultrasound.  She reports no complaints. She reports fetal movement. She denies vaginal bleeding, contractions, or loss of fluid. See flow sheet for details.  Seen last week by OB/GYN to discuss TOLAC, patient signed consent indicating preference for TOLAC at that visit.  Vitals:   12/15/21 1431  BP: 121/77  Pulse: 85   Fetal heart tones and fundal height measured by Dr. Miquel Dunn as patient declined exam by female provider  A/P: Pregnancy at [redacted]w[redacted]d.  Doing well.   Routine prenatal care  Dating reviewed, dating tab is correct Fetal heart tones Appropriate 120 Fundal height within expected range.  36 cm Fetal position confirmed Vertex using Ultrasound at prior visit GBS not collected today due to previously collected. .  Repeat GC/CT not collected today due to previously collected.  The patient does not have a history of HSV and valacyclovir is not indicated at this time.  Infant feeding choice: Breastfeeding Contraception choice: Natural family planning Infant circumcision desired yes Influenza vaccine not administered as not influenza season.   Tdap previously administered between 27-36 weeks  on 10/09/2021 Pregnancy education regarding preterm labor, fetal movement,  benefits of breastfeeding, contraception, fetal growth, expected weight gain, and safe infant sleep were discussed.    2. Pregnancy issues include the following and were addressed as appropriate today:   Problem list and pregnancy box updated: Yes.  Follow up 1 week.

## 2021-12-16 ENCOUNTER — Ambulatory Visit (INDEPENDENT_AMBULATORY_CARE_PROVIDER_SITE_OTHER): Payer: Medicaid Other | Admitting: Certified Nurse Midwife

## 2021-12-16 ENCOUNTER — Other Ambulatory Visit (HOSPITAL_COMMUNITY)
Admission: RE | Admit: 2021-12-16 | Discharge: 2021-12-16 | Disposition: A | Discharge status: Home / Self Care | Payer: Medicaid Other | Source: Ambulatory Visit | Attending: Certified Nurse Midwife | Admitting: Certified Nurse Midwife

## 2021-12-16 VITALS — BP 108/80 | HR 94 | Wt 173.0 lb

## 2021-12-16 DIAGNOSIS — Z3493 Encounter for supervision of normal pregnancy, unspecified, third trimester: Secondary | ICD-10-CM

## 2021-12-16 DIAGNOSIS — Z789 Other specified health status: Secondary | ICD-10-CM

## 2021-12-16 DIAGNOSIS — Z3A37 37 weeks gestation of pregnancy: Secondary | ICD-10-CM

## 2021-12-16 DIAGNOSIS — N760 Acute vaginitis: Secondary | ICD-10-CM

## 2021-12-16 DIAGNOSIS — Z98891 History of uterine scar from previous surgery: Secondary | ICD-10-CM

## 2021-12-16 NOTE — Progress Notes (Signed)
   PRENATAL VISIT NOTE  Subjective:  Katrina Brewer is a 31 y.o. G3P2002 at [redacted]w[redacted]d being seen today for ongoing prenatal care.  She is currently monitored for the following issues for this high-risk pregnancy and has Refugee health examination; TB lung, latent; Positive QuantiFERON-TB Gold test; Nonimmune to hepatitis B virus; Subclinical hyperthyroidism; Elevated LDL cholesterol level; Abnormal appearance of cervix; Supervision of normal pregnancy in third trimester; History of cesarean section; and History of forceps delivery in prior pregnancy, currently pregnant on their problem list.  Patient reports vaginal irritation.  Contractions: Not present. Vag. Bleeding: None.  Movement: Present. Denies leaking of fluid.   The following portions of the patient's history were reviewed and updated as appropriate: allergies, current medications, past family history, past medical history, past social history, past surgical history and problem list.   Objective:   Vitals:   12/16/21 0828  BP: 108/80  Pulse: 94  Weight: 173 lb (78.5 kg)    Fetal Status: Fetal Heart Rate (bpm): 120 Fundal Height: 38 cm Movement: Present  Presentation: Vertex  General:  Alert, oriented and cooperative. Patient is in no acute distress.  Skin: Skin is warm and dry. No rash noted.   Cardiovascular: Normal heart rate noted  Respiratory: Normal respiratory effort, no problems with respiration noted  Abdomen: Soft, gravid, appropriate for gestational age.  Pain/Pressure: Present     Pelvic: Cervical exam deferred        Extremities: Normal range of motion.     Mental Status: Normal mood and affect. Normal behavior. Normal judgment and thought content.   Assessment and Plan:  Pregnancy: G3P2002 at [redacted]w[redacted]d 1. Encounter for supervision of low-risk pregnancy in third trimester - Doing well, feeling regular and vigorous fetal movement   2. [redacted] weeks gestation of pregnancy - Routine OB care   3. History of cesarean  section - Planning TOLAC, will wait for IOL until 41wks   4. Language barrier - Mahin (Dari interpreter) present for translation services  5. Vaginitis - Self-swabs collected  Term labor symptoms and general obstetric precautions including but not limited to vaginal bleeding, contractions, leaking of fluid and fetal movement were reviewed in detail with the patient. Please refer to After Visit Summary for other counseling recommendations.   Return in about 1 week (around 12/23/2021) for IN-PERSON, LOB.  Future Appointments  Date Time Provider Department Center  12/25/2021 10:55 AM Kathlene Cote [facility]  12/29/2021  1:30 PM Arley Phenix [facility]  12/31/2021  8:15 AM Alfredia Ferguson, MD [facility]  01/01/2022  8:55 AM Levin Erp, MD [facility]  01/07/2022  2:15 PM Macon Large, Jethro Bastos, MD [facility]  01/07/2022  3:15 PM [facility]    Bernerd Limbo, CNM

## 2021-12-17 LAB — CERVICOVAGINAL ANCILLARY ONLY
Bacterial Vaginitis (gardnerella): NEGATIVE
Candida Glabrata: NEGATIVE
Candida Vaginitis: NEGATIVE
Chlamydia: NEGATIVE
Comment: NEGATIVE
Comment: NEGATIVE
Comment: NEGATIVE
Comment: NEGATIVE
Comment: NEGATIVE
Comment: NORMAL
Neisseria Gonorrhea: NEGATIVE
Trichomonas: NEGATIVE

## 2021-12-22 ENCOUNTER — Encounter: Payer: Self-pay | Admitting: Student

## 2021-12-23 ENCOUNTER — Other Ambulatory Visit: Payer: Self-pay | Admitting: Student

## 2021-12-24 ENCOUNTER — Encounter: Payer: Self-pay | Admitting: Family Medicine

## 2021-12-25 ENCOUNTER — Inpatient Hospital Stay (HOSPITAL_COMMUNITY)
Admission: AD | Admit: 2021-12-25 | Discharge: 2021-12-27 | DRG: 798 | Disposition: A | Discharge status: Home / Self Care | Payer: Medicaid Other | Attending: Obstetrics and Gynecology | Admitting: Obstetrics and Gynecology

## 2021-12-25 ENCOUNTER — Ambulatory Visit (INDEPENDENT_AMBULATORY_CARE_PROVIDER_SITE_OTHER): Payer: Medicaid Other | Admitting: Medical

## 2021-12-25 ENCOUNTER — Encounter: Payer: Self-pay | Admitting: Medical

## 2021-12-25 VITALS — BP 110/77 | HR 108 | Wt 174.5 lb

## 2021-12-25 DIAGNOSIS — E0590 Thyrotoxicosis, unspecified without thyrotoxic crisis or storm: Secondary | ICD-10-CM | POA: Diagnosis present

## 2021-12-25 DIAGNOSIS — O99891 Other specified diseases and conditions complicating pregnancy: Secondary | ICD-10-CM

## 2021-12-25 DIAGNOSIS — E7800 Pure hypercholesterolemia, unspecified: Secondary | ICD-10-CM

## 2021-12-25 DIAGNOSIS — O09293 Supervision of pregnancy with other poor reproductive or obstetric history, third trimester: Secondary | ICD-10-CM

## 2021-12-25 DIAGNOSIS — Z789 Other specified health status: Secondary | ICD-10-CM

## 2021-12-25 DIAGNOSIS — O99283 Endocrine, nutritional and metabolic diseases complicating pregnancy, third trimester: Secondary | ICD-10-CM

## 2021-12-25 DIAGNOSIS — O34219 Maternal care for unspecified type scar from previous cesarean delivery: Principal | ICD-10-CM | POA: Diagnosis not present

## 2021-12-25 DIAGNOSIS — Z98891 History of uterine scar from previous surgery: Secondary | ICD-10-CM

## 2021-12-25 DIAGNOSIS — N889 Noninflammatory disorder of cervix uteri, unspecified: Secondary | ICD-10-CM

## 2021-12-25 DIAGNOSIS — Z227 Latent tuberculosis: Secondary | ICD-10-CM

## 2021-12-25 DIAGNOSIS — O99284 Endocrine, nutritional and metabolic diseases complicating childbirth: Secondary | ICD-10-CM | POA: Diagnosis present

## 2021-12-25 DIAGNOSIS — Z3A39 39 weeks gestation of pregnancy: Secondary | ICD-10-CM

## 2021-12-25 DIAGNOSIS — O09299 Supervision of pregnancy with other poor reproductive or obstetric history, unspecified trimester: Secondary | ICD-10-CM

## 2021-12-25 DIAGNOSIS — Z3483 Encounter for supervision of other normal pregnancy, third trimester: Secondary | ICD-10-CM

## 2021-12-25 MED ORDER — WESTAB PLUS 27-1 MG PO TABS: 1.0000 | ORAL_TABLET | Freq: Every day | ORAL | 6 refills | Status: DC

## 2021-12-25 NOTE — MAU Note (Signed)
Pt says with interpreter- says UC's strong since  6pm [facility]- clinic No VE  Denies HSV - GBS- neg

## 2021-12-25 NOTE — Progress Notes (Signed)
   PRENATAL VISIT NOTE  Subjective:  Katrina Brewer is a 31 y.o. G3P2002 at [redacted]w[redacted]d being seen today for ongoing prenatal care.  She is currently monitored for the following issues for this high-risk pregnancy and has Refugee health examination; TB lung, latent; Positive QuantiFERON-TB Gold test; Nonimmune to hepatitis B virus; Subclinical hyperthyroidism; Elevated LDL cholesterol level; Abnormal appearance of cervix; Supervision of normal pregnancy in third trimester; History of cesarean section; and History of forceps delivery in prior pregnancy, currently pregnant on their problem list.  Patient reports occasional contractions and pink discharge. Contractions noted 1-2x/hour.  Contractions: Irregular. Vag. Bleeding: Other.  Movement: Present. Denies leaking of fluid.   The following portions of the patient's history were reviewed and updated as appropriate: allergies, current medications, past family history, past medical history, past social history, past surgical history and problem list.   Objective:   Vitals:   12/25/21 1101  BP: 110/77  Pulse: (!) 108  Weight: 174 lb 8 oz (79.2 kg)    Fetal Status: Fetal Heart Rate (bpm): 129 Fundal Height: 39 cm Movement: Present     General:  Alert, oriented and cooperative. Patient is in no acute distress.  Skin: Skin is warm and dry. No rash noted.   Cardiovascular: Normal heart rate noted  Respiratory: Normal respiratory effort, no problems with respiration noted  Abdomen: Soft, gravid, appropriate for gestational age.  Pain/Pressure: Present     Pelvic: Cervical exam deferred        Extremities: Normal range of motion.  Edema: None  Mental Status: Normal mood and affect. Normal behavior. Normal judgment and thought content.   Assessment and Plan:  Pregnancy: G3P2002 at [redacted]w[redacted]d 1. Encounter for supervision of other normal pregnancy in third trimester - Prenatal Vit-Fe Fumarate-FA (WESTAB PLUS) 27-1 MG TABS; Take 1 tablet by mouth daily.   Dispense: 30 tablet; Refill: 6  2. History of cesarean section - Planning VBAC, consent previously signed   3. History of forceps delivery in prior pregnancy, currently pregnant  4. Abnormal appearance of cervix  5. Nonimmune to hepatitis B virus  6. Subclinical hyperthyroidism  7. TB lung, latent  8. Elevated LDL cholesterol level  9. [redacted] weeks gestation of pregnancy  Term labor symptoms and general obstetric precautions including but not limited to vaginal bleeding, contractions, leaking of fluid and fetal movement were reviewed in detail with the patient. Please refer to After Visit Summary for other counseling recommendations.   Return in about 1 week (around 01/01/2022) for LOB, In-Person, any provider.  Future Appointments  Date Time Provider [place]  12/29/2021  1:30 PM Chrystie Nose [facility]  12/31/2021  8:15 AM Stormy Card, MD [facility]  01/01/2022  8:55 AM Gerrit Heck, MD [facility]  01/07/2022  2:15 PM Harolyn Rutherford Sallyanne Havers, MD [facility]  01/07/2022  3:15 PM [facility]    Kerry Hough, PA-C

## 2021-12-25 NOTE — Progress Notes (Signed)
C/o pinkish vaginal discharge . Staci Acosta

## 2021-12-26 ENCOUNTER — Encounter (HOSPITAL_COMMUNITY): Payer: Self-pay | Admitting: Obstetrics and Gynecology

## 2021-12-26 ENCOUNTER — Inpatient Hospital Stay (HOSPITAL_COMMUNITY): Payer: Medicaid Other | Admitting: Anesthesiology

## 2021-12-26 ENCOUNTER — Other Ambulatory Visit: Payer: Self-pay

## 2021-12-26 DIAGNOSIS — O34211 Maternal care for low transverse scar from previous cesarean delivery: Secondary | ICD-10-CM | POA: Diagnosis not present

## 2021-12-26 DIAGNOSIS — E0590 Thyrotoxicosis, unspecified without thyrotoxic crisis or storm: Secondary | ICD-10-CM | POA: Diagnosis present

## 2021-12-26 DIAGNOSIS — O99284 Endocrine, nutritional and metabolic diseases complicating childbirth: Secondary | ICD-10-CM | POA: Diagnosis present

## 2021-12-26 DIAGNOSIS — Z3A39 39 weeks gestation of pregnancy: Secondary | ICD-10-CM | POA: Diagnosis not present

## 2021-12-26 DIAGNOSIS — O34219 Maternal care for unspecified type scar from previous cesarean delivery: Secondary | ICD-10-CM | POA: Diagnosis present

## 2021-12-26 LAB — CBC
HCT: 37.4 % (ref 36.0–46.0)
Hemoglobin: 12.6 g/dL (ref 12.0–15.0)
MCH: 28.2 pg (ref 26.0–34.0)
MCHC: 33.7 g/dL (ref 30.0–36.0)
MCV: 83.7 fL (ref 80.0–100.0)
Platelets: 269 10*3/uL (ref 150–400)
RBC: 4.47 MIL/uL (ref 3.87–5.11)
RDW: 16 % — ABNORMAL HIGH (ref 11.5–15.5)
WBC: 10.8 10*3/uL — ABNORMAL HIGH (ref 4.0–10.5)
nRBC: 0 % (ref 0.0–0.2)

## 2021-12-26 LAB — TYPE AND SCREEN
ABO/RH(D): A POS
Antibody Screen: NEGATIVE

## 2021-12-26 LAB — RPR: RPR Ser Ql: NONREACTIVE

## 2021-12-26 MED ORDER — PHENYLEPHRINE 80 MCG/ML (10ML) SYRINGE FOR IV PUSH (FOR BLOOD PRESSURE SUPPORT): 80.0000 ug | PREFILLED_SYRINGE | INTRAVENOUS | Status: DC | PRN

## 2021-12-26 MED ORDER — OXYCODONE-ACETAMINOPHEN 5-325 MG PO TABS: 1.0000 | ORAL_TABLET | ORAL | Status: DC | PRN

## 2021-12-26 MED ORDER — FENTANYL CITRATE (PF) 100 MCG/2ML IJ SOLN: 100.0000 ug | INTRAMUSCULAR | Status: DC | PRN

## 2021-12-26 MED ORDER — FENTANYL-BUPIVACAINE-NACL 0.5-0.125-0.9 MG/250ML-% EP SOLN
12.0000 mL/h | EPIDURAL | Status: DC | PRN
  Administered 2021-12-26: 12 mL/h via EPIDURAL
  Filled 2021-12-26: qty 250

## 2021-12-26 MED ORDER — EPHEDRINE 5 MG/ML INJ: 10.0000 mg | INTRAVENOUS | Status: DC | PRN

## 2021-12-26 MED ORDER — OXYCODONE HCL 5 MG PO TABS: 5.0000 mg | ORAL_TABLET | ORAL | Status: DC | PRN

## 2021-12-26 MED ORDER — WITCH HAZEL-GLYCERIN EX PADS: 1.0000 | MEDICATED_PAD | CUTANEOUS | Status: DC | PRN

## 2021-12-26 MED ORDER — ONDANSETRON HCL 4 MG/2ML IJ SOLN: 4.0000 mg | INTRAMUSCULAR | Status: DC | PRN

## 2021-12-26 MED ORDER — MEASLES, MUMPS & RUBELLA VAC IJ SOLR: 0.5000 mL | Freq: Once | INTRAMUSCULAR | Status: DC

## 2021-12-26 MED ORDER — DIPHENHYDRAMINE HCL 50 MG/ML IJ SOLN
12.5000 mg | INTRAMUSCULAR | Status: DC | PRN
  Administered 2021-12-26: 12.5 mg via INTRAVENOUS
  Filled 2021-12-26: qty 1

## 2021-12-26 MED ORDER — PRENATAL MULTIVITAMIN CH
1.0000 | ORAL_TABLET | Freq: Every day | ORAL | Status: DC
  Administered 2021-12-26 – 2021-12-27 (×2): 1 via ORAL
  Filled 2021-12-26 (×2): qty 1

## 2021-12-26 MED ORDER — LIDOCAINE-EPINEPHRINE (PF) 2 %-1:200000 IJ SOLN
INTRAMUSCULAR | Status: DC | PRN
  Administered 2021-12-26: 5 mL via EPIDURAL

## 2021-12-26 MED ORDER — ACETAMINOPHEN 325 MG PO TABS
650.0000 mg | ORAL_TABLET | ORAL | Status: DC | PRN
  Administered 2021-12-26: 650 mg via ORAL
  Filled 2021-12-26: qty 2

## 2021-12-26 MED ORDER — OXYCODONE-ACETAMINOPHEN 5-325 MG PO TABS: 2.0000 | ORAL_TABLET | ORAL | Status: DC | PRN

## 2021-12-26 MED ORDER — TETANUS-DIPHTH-ACELL PERTUSSIS 5-2.5-18.5 LF-MCG/0.5 IM SUSY: 0.5000 mL | PREFILLED_SYRINGE | Freq: Once | INTRAMUSCULAR | Status: DC

## 2021-12-26 MED ORDER — ONDANSETRON HCL 4 MG PO TABS: 4.0000 mg | ORAL_TABLET | ORAL | Status: DC | PRN

## 2021-12-26 MED ORDER — DIPHENHYDRAMINE HCL 25 MG PO CAPS: 25.0000 mg | ORAL_CAPSULE | Freq: Four times a day (QID) | ORAL | Status: DC | PRN

## 2021-12-26 MED ORDER — LACTATED RINGERS IV SOLN: 500.0000 mL | INTRAVENOUS | Status: DC | PRN

## 2021-12-26 MED ORDER — DIBUCAINE (PERIANAL) 1 % EX OINT: 1.0000 | TOPICAL_OINTMENT | CUTANEOUS | Status: DC | PRN

## 2021-12-26 MED ORDER — LACTATED RINGERS IV SOLN
500.0000 mL | Freq: Once | INTRAVENOUS | Status: AC
  Administered 2021-12-26: 500 mL via INTRAVENOUS

## 2021-12-26 MED ORDER — IBUPROFEN 600 MG PO TABS
600.0000 mg | ORAL_TABLET | Freq: Four times a day (QID) | ORAL | Status: DC
  Administered 2021-12-26 – 2021-12-27 (×5): 600 mg via ORAL
  Filled 2021-12-26 (×6): qty 1

## 2021-12-26 MED ORDER — COCONUT OIL OIL: 1.0000 | TOPICAL_OIL | Status: DC | PRN

## 2021-12-26 MED ORDER — LACTATED RINGERS IV SOLN: INTRAVENOUS | Status: DC

## 2021-12-26 MED ORDER — LIDOCAINE HCL (PF) 1 % IJ SOLN: 30.0000 mL | INTRAMUSCULAR | Status: DC | PRN

## 2021-12-26 MED ORDER — OXYTOCIN-SODIUM CHLORIDE 30-0.9 UT/500ML-% IV SOLN
2.5000 [IU]/h | INTRAVENOUS | Status: DC
  Filled 2021-12-26: qty 500

## 2021-12-26 MED ORDER — BENZOCAINE-MENTHOL 20-0.5 % EX AERO: 1.0000 | INHALATION_SPRAY | CUTANEOUS | Status: DC | PRN

## 2021-12-26 MED ORDER — SENNOSIDES-DOCUSATE SODIUM 8.6-50 MG PO TABS
2.0000 | ORAL_TABLET | ORAL | Status: DC
  Administered 2021-12-26 – 2021-12-27 (×2): 2 via ORAL
  Filled 2021-12-26 (×2): qty 2

## 2021-12-26 MED ORDER — OXYTOCIN BOLUS FROM INFUSION
333.0000 mL | Freq: Once | INTRAVENOUS | Status: AC
  Administered 2021-12-26: 333 mL via INTRAVENOUS

## 2021-12-26 MED ORDER — SIMETHICONE 80 MG PO CHEW
80.0000 mg | CHEWABLE_TABLET | ORAL | Status: DC | PRN
  Administered 2021-12-27: 80 mg via ORAL
  Filled 2021-12-26: qty 1

## 2021-12-26 MED ORDER — ZOLPIDEM TARTRATE 5 MG PO TABS: 5.0000 mg | ORAL_TABLET | Freq: Every evening | ORAL | Status: DC | PRN

## 2021-12-26 MED ORDER — ACETAMINOPHEN 325 MG PO TABS: 650.0000 mg | ORAL_TABLET | ORAL | Status: DC | PRN

## 2021-12-26 MED ORDER — SOD CITRATE-CITRIC ACID 500-334 MG/5ML PO SOLN: 30.0000 mL | ORAL | Status: DC | PRN

## 2021-12-26 MED ORDER — ONDANSETRON HCL 4 MG/2ML IJ SOLN: 4.0000 mg | Freq: Four times a day (QID) | INTRAMUSCULAR | Status: DC | PRN

## 2021-12-26 NOTE — Lactation Note (Signed)
This note was copied from a baby's chart. Lactation Consultation Note  Patient Name: Katrina Brewer [id number] Date: 12/26/2021 Reason for consult: L&D Initial assessment;Term Age:31 hours Interpreter present for mom. Baby placed STS at breast. Latched well laid mom back d/t colostrum coming to fast for baby. Pouring from side of his mouth and he coughed. Baby BF better in laid back position. Experienced BF mom but has been a while since BF. Breast has been leaking. Mom denied painful latch. Mom massaging breast. Explained at this time it was to much for baby to handle not to massage for right now. Mom stated understood. Mom will be f/u on MBU.  Maternal Data Has patient been taught Hand Expression?: Yes Does the patient have breastfeeding experience prior to this delivery?: Yes How long did the patient breastfeed?: 18 months to her 31 yr old  and 2 yrs to her 30 yr old  Feeding    LATCH Score Latch: Grasps breast easily, tongue down, lips flanged, rhythmical sucking.  Audible Swallowing: Spontaneous and intermittent  Type of Nipple: Everted at rest and after stimulation  Comfort (Breast/Nipple): Soft / non-tender  Hold (Positioning): Assistance needed to correctly position infant at breast and maintain latch.  LATCH Score: 9   Lactation Tools Discussed/Used    Interventions Interventions: Adjust position;Assisted with latch;Support pillows;Skin to skin;Position options;Breast feeding basics reviewed  Discharge    Consult Status Consult Status: Follow-up from L&D Date: 12/26/21 Follow-up type: In-patient    Theodoro Kalata 12/26/2021, 6:11 AM

## 2021-12-26 NOTE — Lactation Note (Signed)
This note was copied from a baby's chart. Lactation Consultation Note  Patient Name: Katrina Brewer [id number] Date: 12/26/2021 Reason for consult: Initial assessment;Term;Breastfeeding assistance Age:31 hours  P3, Term, Infant Female Dari interpreter was present during the consultation.   LC entered the room and the infant was in the bed with the birth parent. The birth parent stated that breastfeeding is going well.  She said that she breast fed her 2 other children and does not need assistance with latch at this time.  Henderson asked the birth parent if she had been shown how to hand express.  She stated that she has not.  Wolfe asked if she would like to learn how to hand express.  The parent was receptive.  LC assisted the parent in massaging the left breast and hand expressing.  No drops were noted.  The birth parent does not have a breast pump at home.  Per the interpreter, she was given a number to call to request a breast pump. All questions were answered.   Current Feeding Plan:  Breastfeed 8+ times per day in 24 hours.  Hand express for stimulation and feed the expressed milk to the infant with a spoon. Call Tobias for assistance with breastfeeding.    Maternal Data Has patient been taught Hand Expression?: Yes Does the patient have breastfeeding experience prior to this delivery?: Yes How long did the patient breastfeed?: 2 years and 18 months  Feeding Mother's Current Feeding Choice: Breast Milk  LATCH Score Latch: Grasps breast easily, tongue down, lips flanged, rhythmical sucking.  Audible Swallowing: Spontaneous and intermittent  Type of Nipple: Everted at rest and after stimulation  Comfort (Breast/Nipple): Soft / non-tender  Hold (Positioning): No assistance needed to correctly position infant at breast.  LATCH Score: 10   Lactation Tools Discussed/Used    Interventions Interventions: Hand express;Breast massage;LC Services brochure  Discharge     Consult Status Consult Status: Follow-up Date: 12/27/21 Follow-up type: In-patient    Lysbeth Penner 12/26/2021, 10:58 AM

## 2021-12-26 NOTE — Progress Notes (Signed)
Patient ID: Katrina Brewer, female   DOB: 19-Jun-1990, 31 y.o.   MRN: [id number]  Comfortable w epidural; desires AROM  VSS, afebrile FHR 120s, +accels, no decels Ctx q 2 mins Cx C/C/vtx +1; ROM w exam for clear fluid  IUP@39 .2wks TOLAC End 1st stage  -Will await urge to push -Anticipate successful VBAC  Myrtis Ser [facility] 12/26/2021 3:27 AM

## 2021-12-26 NOTE — Anesthesia Preprocedure Evaluation (Signed)
Anesthesia Evaluation  Patient identified by MRN, date of birth, ID band Patient awake    Reviewed: Allergy & Precautions, NPO status , Patient's Chart, lab work & pertinent test results  Airway Mallampati: II  TM Distance: >3 FB Neck ROM: Full    Dental no notable dental hx.    Pulmonary neg pulmonary ROS,    Pulmonary exam normal breath sounds clear to auscultation       Cardiovascular negative cardio ROS Normal cardiovascular exam Rhythm:Regular Rate:Normal     Neuro/Psych negative neurological ROS  negative psych ROS   GI/Hepatic negative GI ROS, Neg liver ROS,   Endo/Other  negative endocrine ROS  Renal/GU negative Renal ROS  negative genitourinary   Musculoskeletal negative musculoskeletal ROS (+)   Abdominal   Peds  Hematology negative hematology ROS (+)   Anesthesia Other Findings   Reproductive/Obstetrics (+) Pregnancy                             Anesthesia Physical Anesthesia Plan  ASA: 2  Anesthesia Plan: Epidural   Post-op Pain Management:    Induction:   PONV Risk Score and Plan: Treatment may vary due to age or medical condition  Airway Management Planned: Natural Airway  Additional Equipment:   Intra-op Plan:   Post-operative Plan:   Informed Consent: I have reviewed the patients History and Physical, chart, labs and discussed the procedure including the risks, benefits and alternatives for the proposed anesthesia with the patient or authorized representative who has indicated his/her understanding and acceptance.       Plan Discussed with: Anesthesiologist  Anesthesia Plan Comments: (Patient identified. Risks, benefits, options discussed with patient including but not limited to bleeding, infection, nerve damage, paralysis, failed block, incomplete pain control, headache, blood pressure changes, nausea, vomiting, reactions to medication, itching, and  post partum back pain. Confirmed with bedside nurse the patient's most recent platelet count. Confirmed with the patient that they are not taking any anticoagulation, have any bleeding history or any family history of bleeding disorders. Patient expressed understanding and wishes to proceed. All questions were answered. )        Anesthesia Quick Evaluation  

## 2021-12-26 NOTE — Anesthesia Postprocedure Evaluation (Signed)
Anesthesia Post Note  Patient: Katrina Brewer  Procedure(s) Performed: AN AD HOC LABOR EPIDURAL     Anesthesia Post Evaluation No notable events documented.  Last Vitals:  Vitals:   12/26/21 1231 12/26/21 1653  BP: 106/66 109/69  Pulse: 80 67  Resp: 18 17  Temp: 36.8 C (!) 36.4 C  SpO2: 98% 99%    Last Pain:  Vitals:   12/26/21 1653  TempSrc: Oral  PainSc: 0-No pain   Pain Goal:                Epidural/Spinal Function Cutaneous sensation: Normal sensation (12/26/21 1653), Patient able to flex knees: Yes (12/26/21 1653), Patient able to lift hips off bed: Yes (12/26/21 1653), Back pain beyond tenderness at insertion site: No (12/26/21 1653), Progressively worsening motor and/or sensory loss: No (12/26/21 1653), Bowel and/or bladder incontinence post epidural: No (12/26/21 1653)  Myna Bright

## 2021-12-26 NOTE — Discharge Summary (Signed)
Postpartum Discharge Summary  Date of Service updated***     Patient Name: Katrina Brewer DOB: 01-Aug-1990 MRN: [id number]  Date of admission: 12/25/2021 Delivery date:12/26/2021  Delivering provider: Serita Grammes D  Date of discharge: 12/26/2021  Admitting diagnosis: Labor and delivery, indication for care [O75.9] Intrauterine pregnancy: [redacted]w[redacted]d     Secondary diagnosis:  Principal Problem:   Labor and delivery, indication for care Active Problems:   Subclinical hyperthyroidism   History of cesarean section  Additional problems: none    Discharge diagnosis: Term Pregnancy Delivered and VBAC                                              Post partum procedures:{Postpartum procedures:23558} Augmentation: AROM Complications: None  Hospital course: Onset of Labor With Vaginal Delivery      31 y.o. yo G3P2002 at [redacted]w[redacted]d was admitted in Active Labor on 12/25/2021. Patient had an uncomplicated labor course as follows:  Membrane Rupture Time/Date: 3:20 AM ,12/26/2021   Delivery Method:VBAC, Spontaneous  Episiotomy: None  Lacerations:  None  Patient had an uncomplicated postpartum course.  She is ambulating, tolerating a regular diet, passing flatus, and urinating well. Patient is discharged home in stable condition on 12/26/21.  Newborn Data: Birth date:12/26/2021  Birth time:5:18 AM  Gender:Female  Living status:Living  Apgars:9 ,9  Weight:3760 g (8lb 4.6oz)  Magnesium Sulfate received: No BMZ received: No Rhophylac:N/A MMR:N/A T-DaP:Given prenatally Flu: Yes Transfusion:{Transfusion received:30440034}  Physical exam  Vitals:   12/26/21 0231 12/26/21 0235 12/26/21 0301 12/26/21 0331  BP: (!) 110/56  (!) 93/55 (!) 111/49  Pulse: 71  86 76  Resp:      Temp:      TempSrc:      SpO2:  99%    Weight:      Height:       General: {Exam; general:21111117} Lochia: {Desc; appropriate/inappropriate:30686::"appropriate"} Uterine Fundus: {Desc; firm/soft:30687} Incision:  {Exam; incision:21111123} DVT Evaluation: {Exam; dvt:2111122} Labs: Lab Results  Component Value Date   WBC 10.8 (H) 12/26/2021   HGB 12.6 12/26/2021   HCT 37.4 12/26/2021   MCV 83.7 12/26/2021   PLT 269 12/26/2021      Latest Ref Rng & Units 07/29/2020    3:42 PM  CMP  Glucose 65 - 99 mg/dL 82   BUN 6 - 20 mg/dL 8   Creatinine 0.57 - 1.00 mg/dL 0.53   Sodium 134 - 144 mmol/L 139   Potassium 3.5 - 5.2 mmol/L 4.2   Chloride 96 - 106 mmol/L 102   CO2 20 - 29 mmol/L 22   Calcium 8.7 - 10.2 mg/dL 9.0   Total Protein 6.0 - 8.5 g/dL 7.1   Total Bilirubin 0.0 - 1.2 mg/dL 0.3   Alkaline Phos 44 - 121 IU/L 66   AST 0 - 40 IU/L 16   ALT 0 - 32 IU/L 11    Edinburgh Score:     No data to display           After visit meds:  Allergies as of 12/26/2021   No Known Allergies   Med Rec must be completed prior to using this [facility]***        Discharge home in stable condition Infant Feeding: {Baby feeding:23562} Infant Disposition:{CHL IP OB HOME WITH TYOMAY:04599} Discharge instruction: per After Visit Summary and Postpartum booklet. Activity: Advance as tolerated. Pelvic  rest for 6 weeks.  Diet: routine diet Future Appointments: Future Appointments  Date Time Provider [place]  12/29/2021  1:30 PM Chrystie Nose [facility]  12/31/2021  8:15 AM Trina Ao, Lyndel Safe, MD [facility]  01/01/2022  8:55 AM Gerrit Heck, MD [facility]  01/07/2022  2:15 PM Anyanwu, Sallyanne Havers, MD [facility]  01/07/2022  3:15 PM [facility]   Follow up Visit:  Myrtis Ser, CNM  P [facility] Admin Please schedule this patient for Postpartum visit in: 6 weeks with the following provider: Any provider  In-Person  For C/S patients schedule nurse incision check in weeks 2 weeks: no  High risk pregnancy complicated by: previous C/S  Delivery mode:  VBAC  Anticipated Birth Control:  NFP  PP Procedures needed: none  Edinburgh: unsure Schedule Integrated [place]  visit: no  No relevant baby issues- getting inpatient circ   12/26/2021 Myrtis Ser, CNM

## 2021-12-26 NOTE — Anesthesia Procedure Notes (Signed)
Epidural Patient location during procedure: OB Start time: 12/26/2021 1:10 AM End time: 12/26/2021 1:20 AM  Staffing Anesthesiologist: Freddrick March, MD Performed: anesthesiologist   Preanesthetic Checklist Completed: patient identified, IV checked, risks and benefits discussed, monitors and equipment checked, pre-op evaluation and timeout performed  Epidural Patient position: sitting Prep: DuraPrep and site prepped and draped Patient monitoring: continuous pulse ox, blood pressure, heart rate and cardiac monitor Approach: midline Location: L3-L4 Injection technique: LOR air  Needle:  Needle type: Tuohy  Needle gauge: 17 G Needle length: 9 cm Needle insertion depth: 5 cm Catheter type: closed end flexible Catheter size: 19 Gauge Catheter at skin depth: 10 cm Test dose: negative  Assessment Sensory level: T8 Events: blood not aspirated, injection not painful, no injection resistance, no paresthesia and negative IV test  Additional Notes Patient identified. Risks/Benefits/Options discussed with patient including but not limited to bleeding, infection, nerve damage, paralysis, failed block, incomplete pain control, headache, blood pressure changes, nausea, vomiting, reactions to medication both or allergic, itching and postpartum back pain. Confirmed with bedside nurse the patient's most recent platelet count. Confirmed with patient that they are not currently taking any anticoagulation, have any bleeding history or any family history of bleeding disorders. Patient expressed understanding and wished to proceed. All questions were answered. Sterile technique was used throughout the entire procedure. Please see nursing notes for vital signs. Test dose was given through epidural catheter and negative prior to continuing to dose epidural or start infusion. Warning signs of high block given to the patient including shortness of breath, tingling/numbness in hands, complete motor block,  or any concerning symptoms with instructions to call for help. Patient was given instructions on fall risk and not to get out of bed. All questions and concerns addressed with instructions to call with any issues or inadequate analgesia.  Reason for block:procedure for pain

## 2021-12-26 NOTE — H&P (Signed)
Katrina Brewer is a  31 y.o. female G33P2002 [redacted]w[redacted]d by 9wk u/s with a history of cesarean section followed by VBAC presenting for intense contractions since 3:30 pm today. The patient reports she has had contractions for the past 2 days, and they intensified around 3:30 pm today. Her contractions have gone from every 10 minutes, to every 5 minutes, prompting her to present to the MAU. She also endorses some pink vaginal discharge today, but no overt vaginal bleeding or loss of fluids. Positive fetal movement. No s/s pre-e.   OB History     Gravida  3   Para  2   Term  2   Preterm      AB      Living  2      SAB      IAB      Ectopic      Multiple      Live Births  1        Obstetric Comments  1 C/s and one VBAC that was forceps-assisted        Past Medical History:  Diagnosis Date   Overweight    Refugee health examination    Past Surgical History:  Procedure Laterality Date   CESAREAN SECTION  10/2013   Family History: family history is not on file. Social History:  reports that she has never smoked. She has never used smokeless tobacco. She reports that she does not drink alcohol and does not use drugs.     Maternal Diabetes: No Genetic Screening: Declined Maternal Ultrasounds/Referrals: Normal Fetal Ultrasounds or other Referrals:  None Maternal Substance Abuse:  No Significant Maternal Medications:  None Significant Maternal Lab Results:  Group B Strep negative Number of Prenatal Visits:greater than 3 verified prenatal visits Other Comments:  None  Review of Systems  Eyes:  Negative for visual disturbance.  Respiratory:  Negative for shortness of breath.   Cardiovascular:  Negative for chest pain.  Gastrointestinal:  Negative for abdominal pain, nausea and vomiting.  Neurological:  Negative for dizziness and headaches.   Maternal Medical History:  Reason for admission: Nausea.    Dilation: 7.5 Effacement (%): 90 Station: 0 Exam by::  Santiago Bur, RN Blood pressure 111/78, pulse 91, temperature 97.6 F (36.4 C), temperature source Oral, resp. rate 20, height 5\' 1"  (1.549 m), weight 79.2 kg, last menstrual period 01/22/2021. Exam Physical Exam Constitutional:      General: She is not in acute distress.    Comments: Uncomfortable-appearing  Cardiovascular:     Rate and Rhythm: Normal rate and regular rhythm.     Pulses: Normal pulses.     Heart sounds: Normal heart sounds.  Pulmonary:     Effort: Pulmonary effort is normal. No respiratory distress.     Breath sounds: Normal breath sounds.  Abdominal:     Palpations: Abdomen is soft.     Tenderness: There is no abdominal tenderness.     Comments: Gravid abdomen  Musculoskeletal:     Right lower leg: No edema.     Left lower leg: No edema.  Skin:    General: Skin is warm and dry.  Neurological:     General: No focal deficit present.     Mental Status: She is alert and oriented to person, place, and time.    FHR: 115-120s, +accels, no decels, Cat 1 Ctx q 2-3 min, spont  Prenatal labs: ABO, Rh: A/Positive/-- (02/17 1145) Antibody: Negative (02/17 1145) Rubella: 8.02 (02/17 1145) RPR: Non  Reactive (07/07 0927)  HBsAg: Negative (02/17 1145)  HIV: Non Reactive (07/07 0927)  GBS: Negative/-- (09/01 1508)   Assessment/Plan: Katrina Brewer is a 31 y.o. female H6D1497 [redacted]w[redacted]d with a history of cesarean section presenting for intense contractions, planning for TOLAC.   Labor  The patient is 7.5 cm dilated, 90% effaced, and at 0 station, and is ready for delivery. Will await lab work, then give epidural. Plan for expectant management for TOLAC.    Paulo Fruit 12/26/2021, 12:19 AM  CNM attestation:  I have seen and examined this patient; I agree with above documentation in the med student's note.   Katrina Brewer is a 30 y.o. W2O3785 here for SOL  PE: BP (!) 93/55   Pulse 86   Temp 98.1 F (36.7 C) (Oral)   Resp 18   Ht 5\' 1"  (1.549 m)   Wt  79.2 kg   LMP 01/22/2021   SpO2 99%   BMI 32.99 kg/m  Gen: breathing w ctx Resp: normal effort, no distress Abd: gravid  ROS, labs, PMH reviewed  Plan: Admit to Labor and Delivery Desires epidural Wishes for TOLAC Anticipate successful VBAC  Myrtis Ser 12/26/2021, 3:30 AM

## 2021-12-27 DIAGNOSIS — O34219 Maternal care for unspecified type scar from previous cesarean delivery: Secondary | ICD-10-CM | POA: Diagnosis not present

## 2021-12-27 NOTE — Progress Notes (Signed)
AT T ipad interpreter used in [employer]. Patient verbalized and demonstrated understanding

## 2021-12-27 NOTE — Discharge Instructions (Signed)
-   Continue your prenatal vitamins especially if breastfeeding - Try to eat iron rich food. - Take over the counter tylenol (500mg ) or ibuprofen (200mg ) three times a day as needed for cramping/pain. - Please come back to MAU if you notice persistently elevated blood pressures or you start to have a headache, that doesn't get better with medications (tylenol and ibuprofen), rest (4hrs of sleep) and drinking water.

## 2021-12-27 NOTE — Lactation Note (Signed)
This note was copied from a baby's chart. Lactation Consultation Note  Patient Name: Katrina Brewer [id number] Date: 12/27/2021 Reason for consult: Follow-up assessment;Term;Infant weight loss;Breastfeeding assistance (2.79% WL) Age:31 hours  [place]ari interpreter [phone] used (voice only call iPad)  LC entered the room and the infant was finishing up with a feeding.  Per the birth parent, the infant has been feeding, but he falls asleep. She stated that she does not know why she has to stay in the hospital another day.  LC looked at the feeding log and noted that the longest feeding was 7 min long.  LC let the parent know that feedings need to be at least 15 min and she should offer both breasts.  The birth parent stated that she tried, but the infant falls asleep.  LC told her to try removing the blanket prior to feeding and feed the infant STS.  LC also let her know that the infant should be taking in more milk at this time and his feedings have to increase or he needs to be supplemented.  [place] asked the birth parent if she would be open to pumping.  She stated that she did not have a pump.  LC encouraged her to use the pump that was in the room.  She stated that she did not think she could do it.  [place] asked if she was open to formula or donor milk.  The birth parent stated that she did not want to supplement.  [place] asked if she would try hand expressing and spoon feeding the infant milk.  The birth parent stated that she would try.  The [place] made it very clear that the infant needed to be taking in more milk. The infant's skin appeared to have a yellow undertone.  RN Anda Kraft was notified and she stated that she would take the spoons to the parent. Lactation will follow up with the birth parent.   Maternal Data    Feeding Mother's Current Feeding Choice: Breast Milk  LATCH Score                    Lactation Tools Discussed/Used    Interventions Interventions: Breast  feeding basics reviewed;Education  Discharge    Consult Status Consult Status: Follow-up Date: 12/28/21 Follow-up type: In-patient    Katrina Brewer 12/27/2021, 3:18 PM

## 2021-12-28 ENCOUNTER — Inpatient Hospital Stay (HOSPITAL_COMMUNITY)
Admission: AD | Admit: 2021-12-28 | Discharge: 2021-12-28 | Disposition: A | Discharge status: Home / Self Care | Payer: Medicaid Other | Attending: Family Medicine | Admitting: Family Medicine

## 2021-12-28 ENCOUNTER — Telehealth: Payer: Self-pay | Admitting: Family Medicine

## 2021-12-28 ENCOUNTER — Encounter (HOSPITAL_COMMUNITY): Payer: Self-pay | Admitting: Family Medicine

## 2021-12-28 DIAGNOSIS — R102 Pelvic and perineal pain: Secondary | ICD-10-CM | POA: Diagnosis not present

## 2021-12-28 MED ORDER — LIDOCAINE HCL (PF) 1 % IJ SOLN
30.0000 mL | Freq: Once | INTRAMUSCULAR | Status: AC
  Administered 2021-12-28: 30 mL
  Filled 2021-12-28: qty 30

## 2021-12-28 MED ORDER — BENZOCAINE-MENTHOL 20-0.5 % EX AERO
1.0000 | INHALATION_SPRAY | Freq: Four times a day (QID) | CUTANEOUS | Status: DC | PRN
  Filled 2021-12-28: qty 56

## 2021-12-28 MED ORDER — OXYCODONE-ACETAMINOPHEN 5-325 MG PO TABS
1.0000 | ORAL_TABLET | Freq: Once | ORAL | Status: AC
  Administered 2021-12-28: 1 via ORAL
  Filled 2021-12-28: qty 1

## 2021-12-28 MED ORDER — IBUPROFEN 600 MG PO TABS: 600.0000 mg | ORAL_TABLET | Freq: Four times a day (QID) | ORAL | 0 refills | Status: DC | PRN

## 2021-12-28 NOTE — MAU Provider Note (Signed)
History     CSN: [id number]  Arrival date and time: 12/28/21 1110   Event Date/Time   First Provider Initiated Contact with Patient 12/28/21 1146      Chief Complaint  Patient presents with   Vaginal Pain   31 y.o. G3P3 s/p SVD 2 days ago presenting with perineal pain. Reports onset last night. Rates pain 9/10. Has not taken anything for it. States she has a tear that needs stitches. Reports having a similar tear 4 years ago that did not get stitched and has had pain and itching since.     OB History     Gravida  3   Para  3   Term  3   Preterm      AB      Living  3      SAB      IAB      Ectopic      Multiple  0   Live Births  2        Obstetric Comments  1 C/s and one VBAC that was forceps-assisted         Past Medical History:  Diagnosis Date   Overweight    Refugee health examination     Past Surgical History:  Procedure Laterality Date   CESAREAN SECTION  10/2013    History reviewed. No pertinent family history.  Social History   Tobacco Use   Smoking status: Never   Smokeless tobacco: Never  Vaping Use   Vaping Use: Never used  Substance Use Topics   Alcohol use: Never   Drug use: Never    Allergies: No Known Allergies  Medications Prior to Admission  Medication Sig Dispense Refill Last Dose   Prenatal Vit-Fe Fumarate-FA (WESTAB PLUS) 27-1 MG TABS Take 1 tablet by mouth daily. 30 tablet 6     Review of Systems  Constitutional:  Negative for fever.  Genitourinary:  Positive for vaginal pain.   Physical Exam   Blood pressure 125/85, pulse 84, temperature 99.2 F (37.3 C), temperature source Oral, resp. rate 14, height 5\' 1"  (1.549 m), weight 74.7 kg, SpO2 98 %, unknown if currently breastfeeding.  Physical Exam Vitals and nursing note reviewed.  Constitutional:      General: She is not in acute distress.    Appearance: Normal appearance.  HENT:     Head: Normocephalic and atraumatic.  Pulmonary:     Effort:  Pulmonary effort is normal. No respiratory distress.  Genitourinary:    Comments: Jagged 1st degree perineal laceration extending into vagina; hemostatic Musculoskeletal:        General: Normal range of motion.     Cervical back: Normal range of motion.  Skin:    General: Skin is warm and dry.  Neurological:     General: No focal deficit present.     Mental Status: She is alert and oriented to person, place, and time.  Psychiatric:        Mood and Affect: Mood normal.        Behavior: Behavior normal.   No results found for this or any previous visit (from the past 24 hour(s)).  MAU Course  Procedures  MDM Informed pt that laceration will heal without intervention or I could suture it today however cannot guarantee that will improve long term symptoms that she had before. She opts for repair today.   Procedure: Consented for procedure.  Laceration repaired with 3-0 Vicryl rapide under local anesthesia, minimal blood loss, tolerated  well. All sponges and sharps accounted for.   Assessment and Plan   1. Perineal pain   2. First degree perineal laceration during delivery    Discharge home Follow up at [facility] in 1 week-message sent Return for increased pain, fever, or heavy VB Sitz bath TID Rx Dermoplast Rx Ibuprofen  Video interpreter used  Julianne Handler, CNM 12/28/2021, 1:51 PM

## 2021-12-28 NOTE — MAU Note (Signed)
.  Katrina Brewer is a 31 y.o. postpartum vaginal delivery here in MAU reporting: delivered on Saturday and noticed last night around 1900 that her stitches have ripped open from her repair. States that the pain is constant and unbearable to the point that it hurts to void. Denies heavy vaginal bleeding. Does reports lower abdominal pain below her umbilicus and in her back.   Pain score: 9 Vitals:   12/28/21 1132  BP: 124/87  Pulse: (!) 117  Resp: 14  SpO2: 98%

## 2021-12-28 NOTE — Telephone Encounter (Signed)
Received call from patient and friend [name] that patient is having severe pain with urination and has concerns about an unrepaired vaginal tear. Patient had SVD with manual extraction of placenta. Given ongoing difficulty urinating, recommended evaluation today with OB in MAU or at office.   Dorris Singh, MD  Family Medicine Teaching Service

## 2021-12-29 ENCOUNTER — Encounter: Payer: Self-pay | Admitting: Family Medicine

## 2021-12-29 LAB — SURGICAL PATHOLOGY

## 2021-12-31 ENCOUNTER — Encounter: Payer: Self-pay | Admitting: Family Medicine

## 2022-01-01 ENCOUNTER — Encounter: Payer: No Typology Code available for payment source | Admitting: Student

## 2022-01-04 ENCOUNTER — Other Ambulatory Visit: Payer: Self-pay

## 2022-01-04 ENCOUNTER — Ambulatory Visit (INDEPENDENT_AMBULATORY_CARE_PROVIDER_SITE_OTHER): Payer: Medicaid Other | Admitting: Obstetrics & Gynecology

## 2022-01-04 NOTE — Progress Notes (Signed)
   OFFICE VISIT NOTE  History:   Katrina Brewer is a 31 y.o. [phone] here today for follow up after repair of first degree perineal laceration in MAU on 12/28/21.  She is s/p SVD 12/26/21. Dari interpreter is present for encounter. She still reports some pain near laceration, also has some back pain near epidural site and occasional neck pain.   She denies any other concerns.    Past Medical History:  Diagnosis Date   Overweight    Refugee health examination     Past Surgical History:  Procedure Laterality Date   CESAREAN SECTION  10/2013    The following portions of the patient's history were reviewed and updated as appropriate: allergies, current medications, past family history, past medical history, past social history, past surgical history and problem list.   Review of Systems:  Pertinent items noted in HPI and remainder of comprehensive ROS otherwise negative.  Physical Exam:  BP 126/84   Pulse 78  CONSTITUTIONAL: Well-developed, well-nourished female in no acute distress.  HEENT:  Normocephalic, atraumatic. External right and left ear normal. No scleral icterus.  NECK: Normal range of motion, supple, no masses noted on observation SKIN: No rash noted. Not diaphoretic. No erythema. No pallor. MUSCULOSKELETAL: Normal range of motion. No edema noted. NEUROLOGIC: Alert and oriented to person, place, and time. Normal muscle tone coordination. No cranial nerve deficit noted. PSYCHIATRIC: Normal mood and affect. Normal behavior. Normal judgment and thought content. CARDIOVASCULAR: Normal heart rate noted RESPIRATORY: Effort and breath sounds normal, no problems with respiration noted ABDOMEN: No masses noted. No other overt distention noted.   PELVIC:  Done in presence of RN as chaperone.  Superficial mild separation of perineal edge of the repaired first degree laceration exposing small amount of tissue, treated with silver nitrate. Rest of repair intact. No need for extra  stitches at this point.    Assessment and Plan:     First degree perineal laceration during delivery Healing well. Perineal care and pelvic rest emphasized. Follow up for postpartum visit as scheduled. Routine preventative health maintenance measures emphasized. Please refer to After Visit Summary for other counseling recommendations.   Return for Postpartum check as scheduled.      Verita Schneiders, MD, [place] [facility] [employer], [place]

## 2022-01-07 ENCOUNTER — Encounter: Payer: No Typology Code available for payment source | Admitting: Obstetrics and Gynecology

## 2022-01-07 ENCOUNTER — Other Ambulatory Visit: Payer: Self-pay

## 2022-01-07 ENCOUNTER — Encounter: Payer: No Typology Code available for payment source | Admitting: Obstetrics & Gynecology

## 2022-02-03 ENCOUNTER — Ambulatory Visit: Payer: No Typology Code available for payment source | Admitting: Family Medicine

## 2022-02-17 ENCOUNTER — Encounter: Payer: Self-pay | Admitting: Obstetrics and Gynecology

## 2022-02-17 ENCOUNTER — Ambulatory Visit (INDEPENDENT_AMBULATORY_CARE_PROVIDER_SITE_OTHER): Payer: Medicaid Other | Admitting: Obstetrics and Gynecology

## 2022-02-17 ENCOUNTER — Other Ambulatory Visit: Payer: Self-pay

## 2022-02-17 NOTE — Progress Notes (Signed)
Post Partum Visit Note  Katrina Brewer is a 31 y.o. G62P3003 female who presents for a postpartum visit. She is 7 weeks 4 days postpartum following a normal spontaneous vaginal delivery.  I have fully reviewed the prenatal and intrapartum course. The delivery was at 39 gestational weeks and 2 days.  Anesthesia: epidural. Postpartum course has been well. Baby is doing well. Baby is feeding by breast. Bleeding no bleeding. Bowel function is normal. Bladder function is normal. Patient is sexually active. Contraception method is none. Postpartum depression screening: negative.   The pregnancy intention screening data noted above was reviewed. Potential methods of contraception were discussed. The patient elected to proceed with No data recorded.   Edinburgh Postnatal Depression Scale - 02/17/22 0845       Edinburgh Postnatal Depression Scale:  In the Past 7 Days   I have been able to laugh and see the funny side of things. 0    I have looked forward with enjoyment to things. 0    I have blamed myself unnecessarily when things went wrong. 2    I have been anxious or worried for no good reason. 2    I have felt scared or panicky for no good reason. 0    Things have been getting on top of me. 2    I have been so unhappy that I have had difficulty sleeping. 0    I have felt sad or miserable. 0    I have been so unhappy that I have been crying. 1    The thought of harming myself has occurred to me. 0    Edinburgh Postnatal Depression Scale Total 7             There are no preventive care reminders to display for this patient.  The following portions of the patient's history were reviewed and updated as appropriate: allergies, current medications, past family history, past medical history, past social history, past surgical history, and problem list.  Review of Systems Pertinent items are noted in HPI.  Objective:  BP 119/87   Pulse 81   Wt 165 lb 12.8 oz (75.2 kg)    Breastfeeding Yes   BMI 31.33 kg/m    General:  alert and cooperative   Breasts:  not indicated  Lungs: clear to auscultation bilaterally  Heart:  regular rate and rhythm, S1, S2 normal, no murmur, click, rub or gallop  Abdomen: soft, non-tender; bowel sounds normal; no masses,  no organomegaly   GU exam:  normal well healed perineum, nontender       Assessment:    There are no diagnoses linked to this encounter.  Normal  postpartum exam.   Plan:   Essential components of care per ACOG recommendations:  1.  Mood and well being: Patient with negative depression screening today. Reviewed local resources for support.  - hx of drug use? No.    2. Infant care and feeding:  -Patient currently breastmilk feeding? Yes. Reviewed importance of draining breast regularly to support lactation.  -Social determinants of health (SDOH) reviewed in EPIC. No concerns  3. Sexuality, contraception and birth spacing - Reviewed reproductive life planning. Reviewed contraceptive methods based on pt preferences and effectiveness.  Patient desired No Method - Other Reason today.   - Discussed birth spacing of 18 months  4. Sleep and fatigue -Encouraged family/partner/community support of 4 hrs of uninterrupted sleep to help with mood and fatigue  5. Physical Recovery  - Discussed patients  delivery and complications. She describes her labor as good. - Patient had a Vaginal, no problems at delivery. Patient had a 1st degree laceration. Perineal healing reviewed. Patient expressed understanding - Patient has urinary incontinence? No. - Patient is safe to resume physical and sexual activity  6.  Health Maintenance - HM due items addressed Yes - Last pap smear  Diagnosis  Date Value Ref Range Status  09/10/2020   Final   - Negative for intraepithelial lesion or malignancy (NILM)   Pap smear not done at today's visit.  -Breast Cancer screening indicated? No.   7. Chronic Disease/Pregnancy  Condition follow up: None  - PCP follow up  Lorriane Shire, MD [facility]

## 2022-04-16 ENCOUNTER — Encounter: Payer: Self-pay | Admitting: Family Medicine

## 2022-04-16 ENCOUNTER — Ambulatory Visit (INDEPENDENT_AMBULATORY_CARE_PROVIDER_SITE_OTHER): Payer: Medicaid Other | Admitting: Family Medicine

## 2022-04-16 ENCOUNTER — Other Ambulatory Visit: Payer: Self-pay

## 2022-04-16 VITALS — BP 124/74 | HR 102 | Wt 173.8 lb

## 2022-04-16 DIAGNOSIS — R000 Tachycardia, unspecified: Secondary | ICD-10-CM

## 2022-04-16 DIAGNOSIS — E0590 Thyrotoxicosis, unspecified without thyrotoxic crisis or storm: Secondary | ICD-10-CM | POA: Diagnosis not present

## 2022-04-16 DIAGNOSIS — Z3200 Encounter for pregnancy test, result unknown: Secondary | ICD-10-CM

## 2022-04-16 LAB — POCT URINE PREGNANCY: Preg Test, Ur: NEGATIVE

## 2022-04-16 NOTE — Assessment & Plan Note (Signed)
Repeat today. Denies symptoms, although initially tachycardic.

## 2022-04-16 NOTE — Progress Notes (Signed)
    SUBJECTIVE:   CHIEF COMPLAINT: possible pregnancy  HPI:  The patient speaks Dari  as their primary language.  An interpreter was used for the entire visit.   Katrina Brewer is a 32 y.o.  G3P3003 four months postpartum from an uncomplicated SVD presenting for possible pregnancy. History notable for Cesarean delivery in G1, FAVD in G2, and SVD in G3. 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   She presents today for a pregnancy test. She was not sure how to interpret the home test.  Breastfeeding exclusively baby boy Vaheb.  No issues with going to the bathroom.  She is home with all 3 kids. Sexually active with husband. Not interested in having another kid. Using natural family planning methods.  Has not yet had return of menses.  Does not want another child right now but needs to talk with husband about contraception.   PERTINENT  PMH / PSH/Family/Social History :  Latent Tb (treated with INH)    OBJECTIVE:   BP 124/74   Pulse (!) 102   Wt 173 lb 12.8 oz (78.8 kg)   SpO2 99%   BMI 32.84 kg/m   Today's weight:  Last Weight  Most recent update: 04/16/2022 10:34 AM    Weight  78.8 kg (173 lb 12.8 oz)            Review of prior weights: Filed Weights   04/16/22 1034  Weight: 173 lb 12.8 oz (78.8 kg)    Cardiac: Regular rate and rhythm. Normal S1/S2. No murmurs, rubs, or gallops appreciated. Lungs: Clear bilaterally to ascultation.  Abdomen: Normoactive bowel sounds. No tenderness to deep or light palpation. No rebound or guarding.   Psych: Pleasant and appropriate    ASSESSMENT/PLAN:   Subclinical hyperthyroidism Repeat today. Denies symptoms, although initially tachycardic.   Tachycardia, improved with rest, likely related to exertion vs. Progression of thyroid disease  - TSH and CBC today - Asymptomatic, monitor   Contraception management  - Pregnancy test negative  - Discussed options - She will need to discuss with husband she reports -  Information given in Dari  HCM UTD on Pap and has had flu shot     Dorris Singh, MD  [place]

## 2022-04-16 NOTE — Patient Instructions (Signed)
It was wonderful to see you today.  Please bring ALL of your medications with you to every visit.   Today we talked about:  --Preventing pregnancy  -- Going to get blood work  -- I will send you a letter with results   Please follow up in 2 months   Thank you for choosing [facility].   Please call [phone] with any questions about today's appointment.  Please be sure to schedule follow up at the front  desk before you leave today.   Dorris Singh, MD  Family Medicine

## 2022-04-17 ENCOUNTER — Encounter: Payer: Self-pay | Admitting: Family Medicine

## 2022-04-17 LAB — CBC
Hematocrit: 36.6 % (ref 34.0–46.6)
Hemoglobin: 12.4 g/dL (ref 11.1–15.9)
MCH: 28.4 pg (ref 26.6–33.0)
MCHC: 33.9 g/dL (ref 31.5–35.7)
MCV: 84 fL (ref 79–97)
Platelets: 388 10*3/uL (ref 150–450)
RBC: 4.36 x10E6/uL (ref 3.77–5.28)
RDW: 12.8 % (ref 11.7–15.4)
WBC: 7.9 10*3/uL (ref 3.4–10.8)

## 2022-04-17 LAB — TSH RFX ON ABNORMAL TO FREE T4: TSH: 0.782 u[IU]/mL (ref 0.450–4.500)

## 2022-06-14 ENCOUNTER — Other Ambulatory Visit: Payer: Self-pay

## 2022-06-14 ENCOUNTER — Emergency Department (HOSPITAL_COMMUNITY): Payer: Medicaid Other

## 2022-06-14 ENCOUNTER — Encounter (HOSPITAL_COMMUNITY): Payer: Self-pay | Admitting: Emergency Medicine

## 2022-06-14 ENCOUNTER — Emergency Department (HOSPITAL_COMMUNITY)
Admission: EM | Admit: 2022-06-14 | Discharge: 2022-06-14 | Disposition: A | Discharge status: Home / Self Care | Payer: Medicaid Other | Attending: Emergency Medicine | Admitting: Emergency Medicine

## 2022-06-14 DIAGNOSIS — N939 Abnormal uterine and vaginal bleeding, unspecified: Secondary | ICD-10-CM | POA: Insufficient documentation

## 2022-06-14 LAB — WET PREP, GENITAL
Clue Cells Wet Prep HPF POC: NONE SEEN
Sperm: NONE SEEN
Trich, Wet Prep: NONE SEEN
WBC, Wet Prep HPF POC: <10 (ref <10)
Yeast Wet Prep HPF POC: NONE SEEN

## 2022-06-14 LAB — CBC WITH DIFFERENTIAL/PLATELET
Abs Immature Granulocytes: 0.07 10*3/uL (ref 0.00–0.07)
Basophils Absolute: 0 10*3/uL (ref 0.0–0.1)
Basophils Relative: 0 %
Eosinophils Absolute: 0.1 10*3/uL (ref 0.0–0.5)
Eosinophils Relative: 1 %
HCT: 33.4 % — ABNORMAL LOW (ref 36.0–46.0)
Hemoglobin: 11.1 g/dL — ABNORMAL LOW (ref 12.0–15.0)
Immature Granulocytes: 1 %
Lymphocytes Relative: 16 %
Lymphs Abs: 1.6 10*3/uL (ref 0.7–4.0)
MCH: 28 pg (ref 26.0–34.0)
MCHC: 33.2 g/dL (ref 30.0–36.0)
MCV: 84.3 fL (ref 80.0–100.0)
Monocytes Absolute: 0.5 10*3/uL (ref 0.1–1.0)
Monocytes Relative: 5 %
Neutro Abs: 7.9 10*3/uL — ABNORMAL HIGH (ref 1.7–7.7)
Neutrophils Relative %: 77 %
Platelets: 388 10*3/uL (ref 150–400)
RBC: 3.96 MIL/uL (ref 3.87–5.11)
RDW: 12.4 % (ref 11.5–15.5)
WBC: 10.2 10*3/uL (ref 4.0–10.5)
nRBC: 0 % (ref 0.0–0.2)

## 2022-06-14 LAB — BASIC METABOLIC PANEL
Anion gap: 10 (ref 5–15)
BUN: 9 mg/dL (ref 6–20)
CO2: 20 mmol/L — ABNORMAL LOW (ref 22–32)
Calcium: 8.6 mg/dL — ABNORMAL LOW (ref 8.9–10.3)
Chloride: 105 mmol/L (ref 98–111)
Creatinine, Ser: 0.57 mg/dL (ref 0.44–1.00)
GFR, Estimated: >60 mL/min (ref >60)
Glucose, Bld: 88 mg/dL (ref 70–99)
Potassium: 4 mmol/L (ref 3.5–5.1)
Sodium: 135 mmol/L (ref 135–145)

## 2022-06-14 MED ORDER — MEGESTROL ACETATE 40 MG PO TABS: 40.0000 mg | ORAL_TABLET | Freq: Two times a day (BID) | ORAL | 0 refills | Status: DC

## 2022-06-14 NOTE — ED Triage Notes (Signed)
Pt reports abnormal menstrual cycle. Pt reports bleeding for a couple of days, then no bleeding, the will begin bleeding after 3 days. Pt reports episodes of heavy bleeding. Pt denies recent fever, reports headache, reports some dizziness with the bleeding.

## 2022-06-14 NOTE — Discharge Instructions (Addendum)
Return if any problems.  Call Family practice to be seen for recheck

## 2022-06-14 NOTE — ED Provider Notes (Signed)
[facility] Provider Note   CSN: [id number] Arrival date & time: 06/14/22  0930     History  Chief Complaint  Patient presents with   Vaginal Bleeding    Katrina Brewer is a 32 y.o. female.  Patient complains of heavy vaginal bleeding for the past 3 days.  Patient reports that she had a normal period at the end of February.  She is not due for her menstrual cycle until the end of March.  Patient is 5 months postpartum.  Patient is currently breast-feeding.  Patient denies any history of abnormal periods in the past no history of fibroids no history of ovarian cyst  The history is provided by the patient. No language interpreter was used.  Vaginal Bleeding Quality:  Bright red Severity:  Moderate Onset quality:  Gradual Duration:  3 days Timing:  Constant Progression:  Worsening Chronicity:  New Possible pregnancy: no   Relieved by:  Nothing Worsened by:  Nothing Ineffective treatments:  None tried Associated symptoms: no abdominal pain   Risk factors: no bleeding disorder, no hx of ectopic pregnancy and no ovarian torsion        Home Medications Prior to Admission medications   Medication Sig Start Date End Date Taking? Authorizing Provider  megestrol (MEGACE) 40 MG tablet Take 1 tablet (40 mg total) by mouth 2 (two) times daily. 06/14/22  Yes Caryl Ada K, PA-C  ibuprofen (ADVIL) 600 MG tablet Take 1 tablet (600 mg total) by mouth every 6 (six) hours as needed. Patient not taking: Reported on 04/16/2022 12/28/21   Julianne Handler, CNM  Prenatal Vit-Fe Fumarate-FA (WESTAB PLUS) 27-1 MG TABS Take 1 tablet by mouth daily. 12/25/21   Luvenia Redden, PA-C      Allergies    Patient has no known allergies.    Review of Systems   Review of Systems  Gastrointestinal:  Negative for abdominal pain.  Genitourinary:  Positive for vaginal bleeding.  All other systems reviewed and are negative.   Physical Exam Updated Vital  Signs BP 114/69   Pulse 96   Temp 98.1 F (36.7 C) (Oral)   Resp 12   Ht '5\' 1"'$  (1.549 m)   Wt 69 kg   SpO2 100%   BMI 28.74 kg/m  Physical Exam Vitals and nursing note reviewed.  Constitutional:      Appearance: She is well-developed.  HENT:     Head: Normocephalic.  Cardiovascular:     Rate and Rhythm: Normal rate.  Pulmonary:     Effort: Pulmonary effort is normal.  Abdominal:     General: Abdomen is flat. There is no distension.     Palpations: Abdomen is soft.  Musculoskeletal:        General: Normal range of motion.     Cervical back: Normal range of motion.     Comments: Moderate vaginal bleeding   Neurological:     General: No focal deficit present.     Mental Status: She is alert and oriented to person, place, and time.  Psychiatric:        Mood and Affect: Mood normal.     ED Results / Procedures / Treatments   Labs (all labs ordered are listed, but only abnormal results are displayed) Labs Reviewed  CBC WITH DIFFERENTIAL/PLATELET - Abnormal; Notable for the following components:      Result Value   Hemoglobin 11.1 (*)    HCT 33.4 (*)    Neutro Abs  7.9 (*)    All other components within normal limits  BASIC METABOLIC PANEL - Abnormal; Notable for the following components:   CO2 20 (*)    Calcium 8.6 (*)    All other components within normal limits  WET PREP, GENITAL  GC/CHLAMYDIA PROBE AMP ([place]) NOT AT [facility]    EKG None  Radiology US PELVIC COMPLETE W TRANSVAGINAL AND TORSION R/O  Result Date: 06/14/2022 CLINICAL DATA:  Pain and vaginal bleeding. EXAM: TRANSABDOMINAL AND TRANSVAGINAL ULTRASOUND OF PELVIS DOPPLER ULTRASOUND OF OVARIES TECHNIQUE: Both transabdominal and transvaginal ultrasound examinations of the pelvis were performed. Transabdominal technique was performed for global imaging of the pelvis including uterus, ovaries, adnexal regions, and pelvic cul-de-sac. It was necessary to proceed with endovaginal exam following the  transabdominal exam to visualize the endometrium. Color and duplex Doppler ultrasound was utilized to evaluate blood flow to the ovaries. COMPARISON:  No comparison studies available. FINDINGS: Uterus Measurements: 8.7 x 4.7 x 5.8 cm = volume: 123 mL. No fibroids or other mass visualized. Endometrium Thickness: 12-13 mm.  No focal abnormality visualized. Right ovary Measurements: 3.7 x 1.9 x 2.0 cm = volume: 7.4 mL. Normal appearance/no adnexal mass. Left ovary Measurements: 3.1 x 2.2 x 2.7 cm = volume: 9.8 mL. Normal appearance/no adnexal mass. Pulsed Doppler evaluation of both ovaries demonstrates normal low-resistance arterial and venous waveforms. Other findings No abnormal free fluid. IMPRESSION: 1. No adnexal mass or evidence for ovarian torsion. 2. Endometrial thickness measures 12-13 mm. If bleeding remains unresponsive to hormonal or medical therapy, sonohysterogram should be considered for focal lesion work-up. (Ref: Radiological Reasoning: Algorithmic Workup of Abnormal Vaginal Bleeding with Endovaginal Sonography and Sonohysterography. AJR [id number]) Electronically Signed   By: Misty Stanley M.D.   On: 06/14/2022 13:08    Procedures Procedures    Medications Ordered in ED Medications - No data to display  ED Course/ Medical Decision Making/ A&P Clinical Course as of 06/14/22 1459  Mon Jun 14, 2022  1241 US PELVIC COMPLETE W TRANSVAGINAL AND TORSION R/O [LS]    Clinical Course User Index [LS] Fransico Meadow, PA-C                             Medical Decision Making Pt reports heavy bleeding today   Amount and/or Complexity of Data Reviewed Independent Historian:     Details: Sister in law  Labs: ordered.    Details: Las ordered reviewed and interpreted.   Radiology: ordered. Decision-making details documented in ED Course.    Details: Ultrasound pelvis,  no torsion, no fibroids  Discussion of management or test interpretation with external provider(s): I discussed with  MAU APP.  She advised  Megace  '40mg'$  bid x 10 days.  Pt referred to [facility] practice for follow up   Risk Prescription drug management. Decision not to resuscitate or to de-escalate care because of poor prognosis.           Final Clinical Impression(s) / ED Diagnoses Final diagnoses:  Abnormal vaginal bleeding    Rx / DC Orders ED Discharge Orders          Ordered    megestrol (MEGACE) 40 MG tablet  2 times daily        06/14/22 1339           An After Visit Summary was printed and given to the patient.    Fransico Meadow, [place] 06/14/22 1955  Valarie Merino, MD 06/15/22 0830

## 2022-06-15 LAB — GC/CHLAMYDIA PROBE AMP (~~LOC~~) NOT AT ARMC
Chlamydia: NEGATIVE
Comment: NEGATIVE
Comment: NORMAL
Neisseria Gonorrhea: NEGATIVE

## 2022-06-16 ENCOUNTER — Ambulatory Visit: Payer: Medicaid Other

## 2022-06-16 NOTE — Progress Notes (Deleted)
    SUBJECTIVE:   CHIEF COMPLAINT / HPI:   AUB follow up ED 3/11, rx Megace 40mg  bid x 10 days, TVUS ok although thickened "Endometrial thickness measures 12-13 mm. If bleeding remains unresponsive to hormonal or medical therapy, sonohysterogram should be considered for focal lesion work-up"  PERTINENT  PMH / PSH: ***  OBJECTIVE:   There were no vitals taken for this visit.  ***  ASSESSMENT/PLAN:   No problem-specific Assessment & Plan notes found for this encounter.     Ezequiel Essex, MD [place]

## 2022-06-17 ENCOUNTER — Other Ambulatory Visit: Payer: Self-pay

## 2022-06-17 ENCOUNTER — Ambulatory Visit (INDEPENDENT_AMBULATORY_CARE_PROVIDER_SITE_OTHER): Payer: Medicaid Other | Admitting: Family Medicine

## 2022-06-17 ENCOUNTER — Encounter: Payer: Self-pay | Admitting: Family Medicine

## 2022-06-17 VITALS — BP 122/59 | HR 98 | Ht 61.0 in | Wt 174.4 lb

## 2022-06-17 DIAGNOSIS — N939 Abnormal uterine and vaginal bleeding, unspecified: Secondary | ICD-10-CM | POA: Diagnosis present

## 2022-06-17 LAB — POCT URINE PREGNANCY: Preg Test, Ur: NEGATIVE

## 2022-06-17 MED ORDER — FERROUS SULFATE 325 (65 FE) MG PO TABS: 325.0000 mg | ORAL_TABLET | ORAL | 0 refills | Status: DC

## 2022-06-17 NOTE — Progress Notes (Signed)
    SUBJECTIVE:   CHIEF COMPLAINT / HPI:   Vaginal Bleeding -started March 8th -heavy, wearing diapers, 6-7 per day, totally soaked through -in February had episode where she bled for ~14 days, unsure of exact dates -she is 6 months postpartum, breastfeeding -this is her 3rd period since delivery of her child 6 months ago -prior to her most recent pregnancy she had normal periods, not heavy (2-3 pads per day), lasted 3 days -seen in ED 3/11 for her symptoms, was prescribed megace 40mg  BID x10 days -taking megace only once daily, started it on 3/12 -no significant abdominal cramping/pain -some lightheadedness, no syncope, no significant fatigue, no palpitations, no shortness of breath  PERTINENT  PMH / PSH: refugee, latent TB  OBJECTIVE:   BP (!) 122/59   Pulse 98   Ht 5\' 1"  (1.549 m)   Wt 174 lb 6.4 oz (79.1 kg)   SpO2 100%   BMI 32.95 kg/m   Gen: NAD, pleasant, able to participate in exam CV: RRR, normal S1/S2, no murmur Resp: Normal effort, lungs CTAB GI: abdomen soft, nontender, nondistended GU: deferred, pelvic exam done in ED on 3/11 Extremities: no edema or cyanosis Skin: warm and dry, no rashes noted Neuro: alert, no obvious focal deficits Psych: Normal affect and mood   ASSESSMENT/PLAN:   Vaginal bleeding Upreg negative today. This seems to be a normal menstrual cycle although quite heavy. Discussed changes in periods are common postpartum and may improve with time. Hgb stable at 11.1 on 06/14/22. Pelvic ultrasound without significant findings. -Continue Megace 40mg  BID as prescribed in the ED. Counseled on proper use -Start iron supplement -Follow up in 1 month -Consider referral to OBGYN if persistent issues -ED/return precautions reviewed   In-person Dari interpreter used for duration of encounter  Alcus Dad, MD [place]

## 2022-06-17 NOTE — Patient Instructions (Addendum)
It was great to see you!  Continue taking the Megace (medication that was prescribed in the Emergency Room). This should be taken TWICE daily until it's gone.   Please also start IRON supplementation. I have sent a prescription to your pharmacy. This should be taken once every other day (it's also ok to take once daily if that's easier, but it can sometimes cause constipation and upset stomach).  Follow up in 2 weeks.  If you develop significant fatigue, dizziness, palpitations, shortness of breath, or passing out please seek immediate care.   Take care, Dr Rock Nephew

## 2022-06-17 NOTE — Assessment & Plan Note (Addendum)
Upreg negative today. This seems to be a normal menstrual cycle although quite heavy. Discussed changes in periods are common postpartum and may improve with time. Hgb stable at 11.1 on 06/14/22. Pelvic ultrasound without significant findings. -Continue Megace '40mg'$  BID as prescribed in the ED. Counseled on proper use -Start iron supplement -Follow up in 1 month -Consider referral to OBGYN if persistent issues -ED/return precautions reviewed

## 2022-06-21 IMAGING — US US OB TRANSVAGINAL
1 series · 15 of 28 positions shown · non-contrast
Comparison: None.

CLINICAL DATA: Dating.

EXAM:
TRANSVAGINAL OB ULTRASOUND
TECHNIQUE: Transvaginal ultrasound was performed for complete evaluation of the
gestation as well as the maternal uterus, adnexal regions, and
pelvic cul-de-sac.

[Series 1: us ob transvaginal · 15 of 51 slices shown]
[im 1/51]
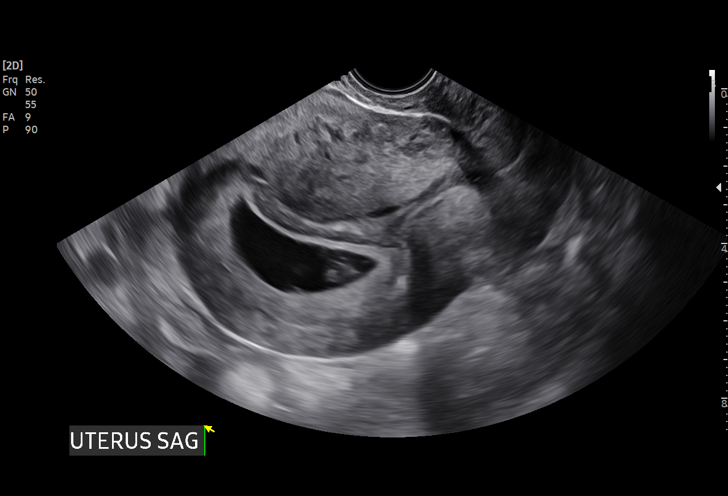
[im 4/51]
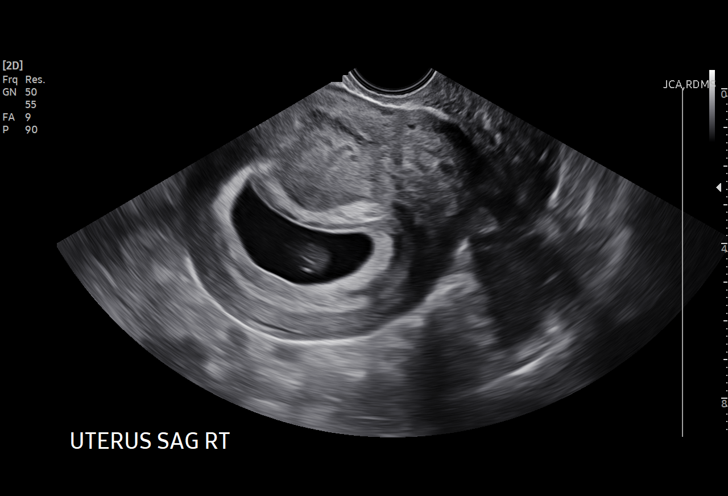
[im 8/51]
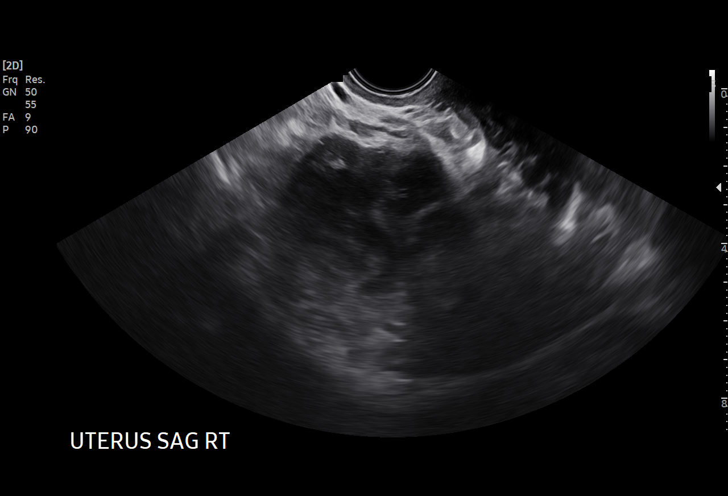
[im 12/51]
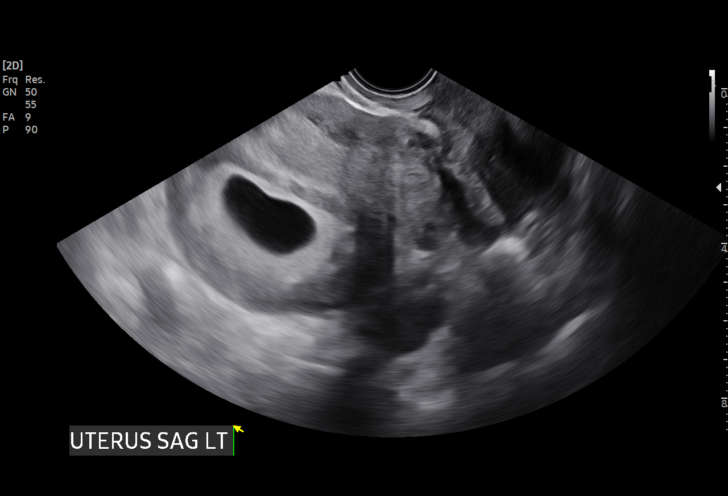
[im 15/51]
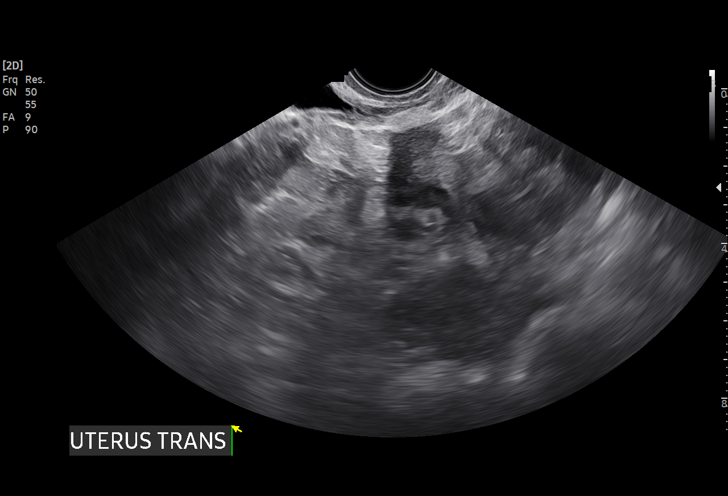
[im 19/51]
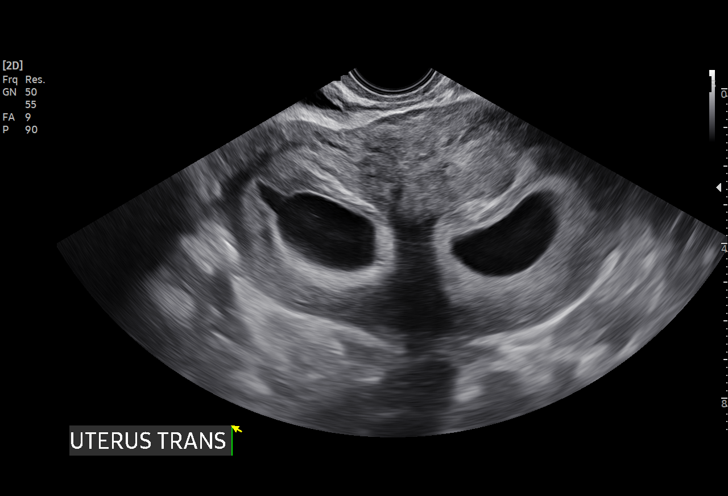
[im 23/51]
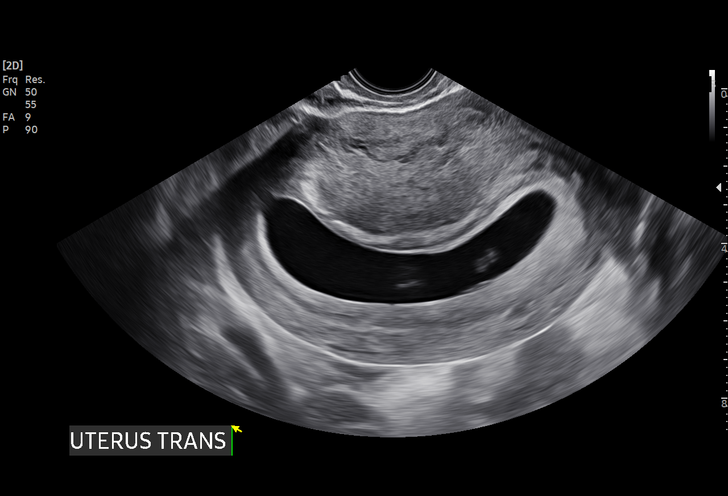
[im 26/51]
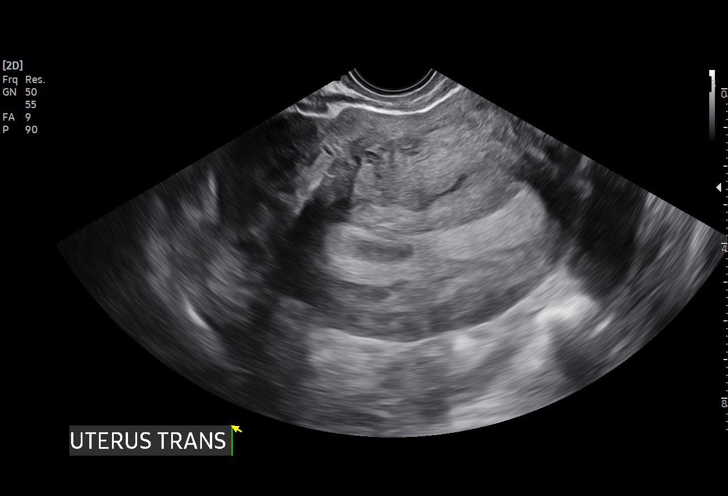
[im 28/51]
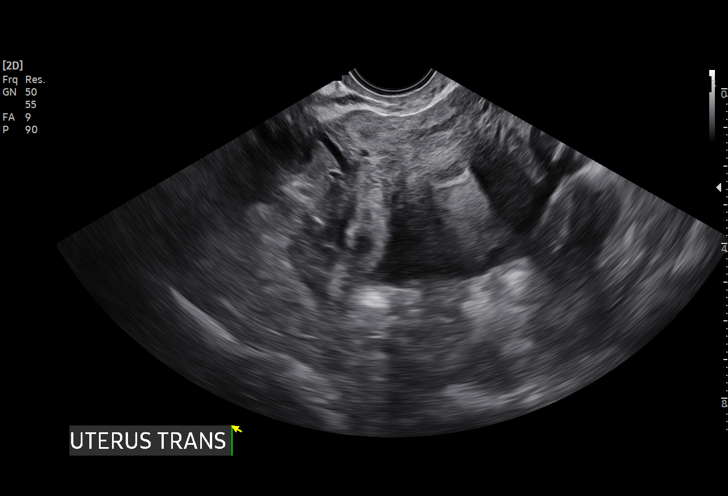
[im 32/51]
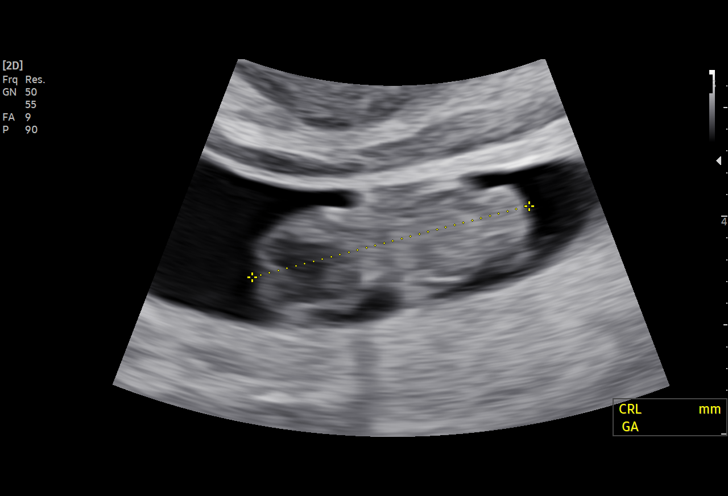
[im 36/51]
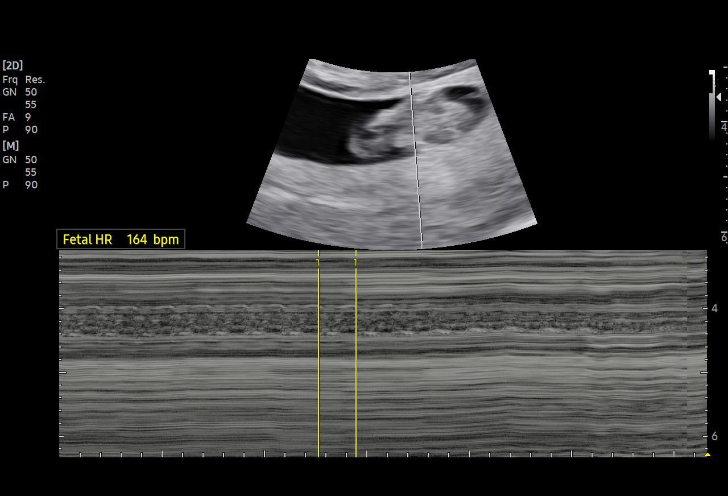
[im 39/51]
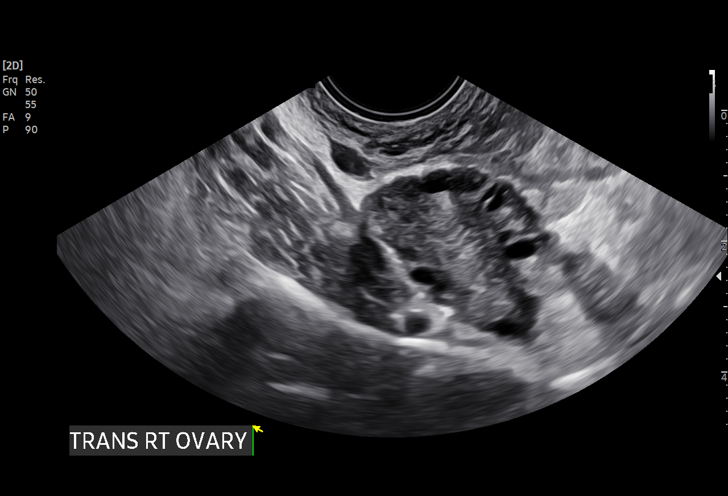
[im 43/51]
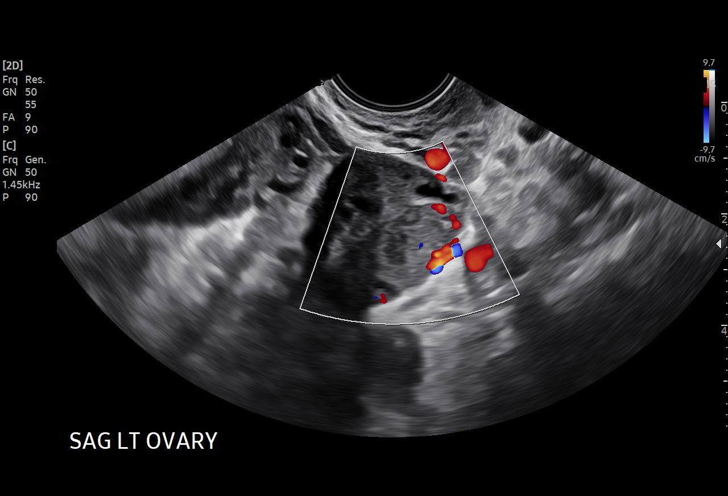
[im 47/51]
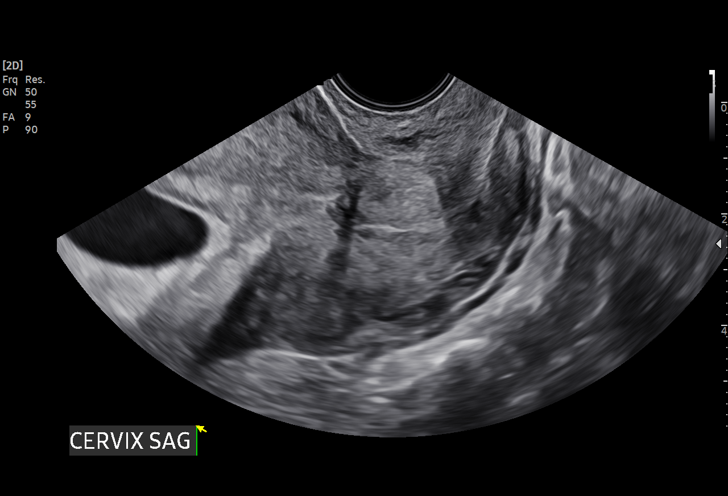
[im 51/51]
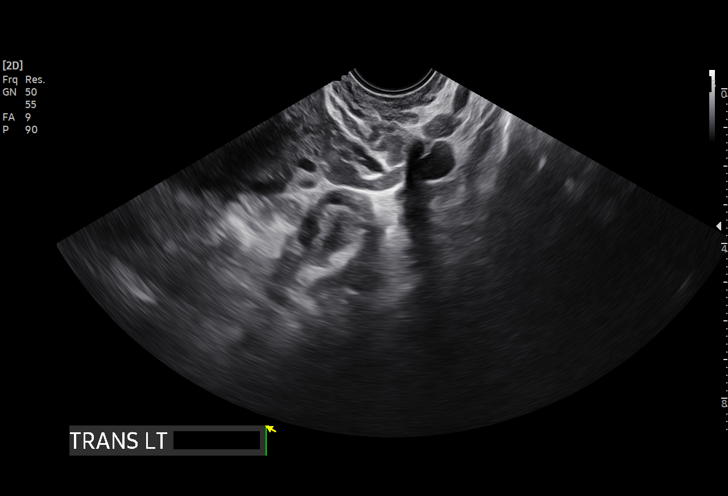

[15 of 28 positions shown; findings below may reference images not displayed]

FINDINGS: Intrauterine gestational sac: Single

Yolk sac:  Visualized.

Embryo:  Visualized.

Cardiac Activity: Visualized.

Heart Rate: 164 bpm

CRL:   26.6 mm   9 w 4 d                  US EDC: December 31, 2021

Subchorionic hemorrhage:  None visualized.

Maternal uterus/adnexae: The right ovary measures 3.9 cm x 2.0 cm x
2.4 cm and is normal in appearance.

The left ovary measures 3.4 cm x 2.4 cm x 1.9 cm and is normal in
appearance.

No pelvic free fluid is seen.
IMPRESSION: Single, viable intrauterine pregnancy at approximately 9 weeks and 4
days gestation by ultrasound evaluation.

## 2022-07-28 ENCOUNTER — Ambulatory Visit (INDEPENDENT_AMBULATORY_CARE_PROVIDER_SITE_OTHER): Payer: Medicaid Other | Admitting: Student

## 2022-07-28 ENCOUNTER — Encounter: Payer: Self-pay | Admitting: Student

## 2022-07-28 VITALS — BP 110/74 | HR 102 | Ht 61.0 in | Wt 164.8 lb

## 2022-07-28 DIAGNOSIS — R202 Paresthesia of skin: Secondary | ICD-10-CM | POA: Insufficient documentation

## 2022-07-28 DIAGNOSIS — N939 Abnormal uterine and vaginal bleeding, unspecified: Secondary | ICD-10-CM | POA: Diagnosis not present

## 2022-07-28 LAB — POCT GLYCOSYLATED HEMOGLOBIN (HGB A1C): Hemoglobin A1C: 5.2 % (ref 4.0–5.6)

## 2022-07-28 MED ORDER — FERROUS SULFATE 325 (65 FE) MG PO TABS: 325.0000 mg | ORAL_TABLET | ORAL | 0 refills | Status: DC

## 2022-07-28 NOTE — Assessment & Plan Note (Addendum)
She says that this has been going on for the last couple of months.  That it resolved with the iron tablet makes me think that it is more likely restless leg syndrome as this happens at nighttime for her.  I will go ahead and check for other causes of peripheral neuropathy in the meantime though. - CBC - Basic Metabolic Panel - Vitamin B12 - TSH Rfx on Abnormal to Free T4 - HgB A1c - ferrous sulfate 325 (65 FE) MG tablet; Take 1 tablet (325 mg total) by mouth every other day.  Dispense: 45 tablet; Refill: 0 -1 month follow up to see how her symptoms are

## 2022-07-28 NOTE — Patient Instructions (Signed)
It was great to see you! Thank you for allowing me to participate in your care!   Our plans for today:  -I am refilling your iron tablet to see if this may help with the burning in her feet -I am getting some labs today to check on whether anything is causing you to have this nerve pain -Please follow-up in 2 to 4 weeks to see if this is improving and if not we could consider a low-dose medication to take at nighttime  Take care and seek immediate care sooner if you develop any concerns.  Levin Erp, MD

## 2022-07-28 NOTE — Progress Notes (Signed)
    SUBJECTIVE:   CHIEF COMPLAINT / HPI: Vaginal bleeding f/u  In person Dari interpreter used for entire encounter  Seen 3/14 for vaginal bleeding.  She was also seen in the ED on 3/11 and was put on Megace for 10 days.  Cycles have been pretty heavy and pelvic ultrasound did not show any significant findings.  Hemoglobin is stable at that time 11.1.  Was started on an iron supplement as well.  Since then says after stopping Megace she has had normal.-Denies any heavy flow anymore.  She is not symptomatic-denies any shortness of breath, palpitations, fatigue.  The only thing that she has mentioned is that she has noticed that the bottom of both of her feet gets pretty tingly/has a burning sensation mostly at nighttime and in the mornings.  She has noticed this for the last 2 to 3 months.  She said that while she was on the iron tablet that this resolved.  No tingling in her hands.  PERTINENT  PMH / PSH:   OBJECTIVE:   BP 110/74   Pulse (!) 102   Ht  (1.549 m)   Wt 164 lb 12.8 oz (74.8 kg)   LMP 07/12/2022   SpO2 100%   BMI 31.14 kg/m   General: Well appearing, NAD, awake, alert, responsive to questions Head: Normocephalic atraumatic Respiratory: chest rises symmetrically,  no increased work of breathing Extremities: Moves upper and lower extremities freely, no edema in LE; 2+ pulses, no decreased sensation in feet  ASSESSMENT/PLAN:   Vaginal bleeding S/p Megace.  Overall normal pelvic ultrasound.  Resolved currently. -Will continue iron supplementation in the meantime for below problem -CBC  Tingling of both feet She says that this has been going on for the last couple of months.  That it resolved with the iron tablet makes me think that it is more likely restless leg syndrome as this happens at nighttime for her.  I will go ahead and check for other causes of peripheral neuropathy in the meantime though. - CBC - Basic Metabolic Panel - Vitamin B12 - TSH Rfx on Abnormal  to Free T4 - HgB A1c - ferrous sulfate 325 (65 FE) MG tablet; Take 1 tablet (325 mg total) by mouth every other day.  Dispense: 45 tablet; Refill: 0 -1 month follow up to see how her symptoms are   Levin Erp, MD [facility]

## 2022-07-28 NOTE — Assessment & Plan Note (Signed)
S/p Megace.  Overall normal pelvic ultrasound.  Resolved currently. -Will continue iron supplementation in the meantime for below problem -CBC

## 2022-07-29 ENCOUNTER — Encounter: Payer: Self-pay | Admitting: Student

## 2022-07-29 LAB — TSH RFX ON ABNORMAL TO FREE T4: TSH: 0.173 u[IU]/mL — ABNORMAL LOW (ref 0.450–4.500)

## 2022-07-29 LAB — CBC
Hematocrit: 36.5 % (ref 34.0–46.6)
Hemoglobin: 12.3 g/dL (ref 11.1–15.9)
MCH: 26.4 pg — ABNORMAL LOW (ref 26.6–33.0)
MCHC: 33.7 g/dL (ref 31.5–35.7)
MCV: 78 fL — ABNORMAL LOW (ref 79–97)
Platelets: 425 10*3/uL (ref 150–450)
RBC: 4.66 x10E6/uL (ref 3.77–5.28)
RDW: 12.2 % (ref 11.7–15.4)
WBC: 8.2 10*3/uL (ref 3.4–10.8)

## 2022-07-29 LAB — BASIC METABOLIC PANEL
BUN/Creatinine Ratio: 17 (ref 9–23)
BUN: 10 mg/dL (ref 6–20)
CO2: 21 mmol/L (ref 20–29)
Calcium: 9.5 mg/dL (ref 8.7–10.2)
Chloride: 103 mmol/L (ref 96–106)
Creatinine, Ser: 0.59 mg/dL (ref 0.57–1.00)
Glucose: 125 mg/dL — ABNORMAL HIGH (ref 70–99)
Potassium: 4.3 mmol/L (ref 3.5–5.2)
Sodium: 142 mmol/L (ref 134–144)
eGFR: 123 mL/min/{1.73_m2} (ref >59)

## 2022-07-29 LAB — VITAMIN B12: Vitamin B-12: 392 pg/mL (ref 232–1245)

## 2022-07-29 LAB — T4F: T4,Free (Direct): 1.46 ng/dL (ref 0.82–1.77)

## 2022-08-27 ENCOUNTER — Ambulatory Visit: Payer: Self-pay | Admitting: Student

## 2023-04-21 ENCOUNTER — Ambulatory Visit (HOSPITAL_COMMUNITY)
Admission: EM | Admit: 2023-04-21 | Discharge: 2023-04-21 | Disposition: A | Discharge status: Home / Self Care | Payer: Medicaid Other | Attending: Emergency Medicine | Admitting: Emergency Medicine

## 2023-04-21 ENCOUNTER — Encounter (HOSPITAL_COMMUNITY): Payer: Self-pay

## 2023-04-21 DIAGNOSIS — R1030 Lower abdominal pain, unspecified: Secondary | ICD-10-CM | POA: Diagnosis not present

## 2023-04-21 DIAGNOSIS — Z3201 Encounter for pregnancy test, result positive: Secondary | ICD-10-CM

## 2023-04-21 DIAGNOSIS — O26899 Other specified pregnancy related conditions, unspecified trimester: Secondary | ICD-10-CM

## 2023-04-21 DIAGNOSIS — Z3A01 Less than 8 weeks gestation of pregnancy: Secondary | ICD-10-CM

## 2023-04-21 LAB — POCT URINALYSIS DIP (MANUAL ENTRY)
Bilirubin, UA: NEGATIVE
Blood, UA: NEGATIVE
Glucose, UA: NEGATIVE mg/dL
Ketones, POC UA: NEGATIVE mg/dL
Leukocytes, UA: NEGATIVE
Nitrite, UA: NEGATIVE
Protein Ur, POC: NEGATIVE mg/dL
Spec Grav, UA: 1.02 (ref 1.010–1.025)
Urobilinogen, UA: 0.2 U/dL
pH, UA: 6.5 (ref 5.0–8.0)

## 2023-04-21 LAB — POCT URINE PREGNANCY: Preg Test, Ur: POSITIVE — AB

## 2023-04-21 NOTE — ED Provider Notes (Signed)
[facility]    CSN: [id number] Arrival date & time: 04/21/23  1903    HISTORY   Chief Complaint  Patient presents with   Abdominal Pain   HPI Jenisse Jan Callies is a pleasant, 33 y.o. female who presents to urgent care today. Patient complains of bilateral, lower abdominal pain that began at 5 PM today.  Denies vaginal bleeding, burning with urination, increased frequency of urination, abnormal vaginal discharge, flank pain, lower back pain, constipation, diarrhea, blood in stool.  Patient states pain varies in intensity, hurt worse after she emptied her bladder while providing a urine sample today, now not hurting very much.  The history is provided by the patient. A language interpreter was used ([employer] used for interpretation of English to [place]   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 No pertinent family history. Social History Social History   Tobacco Use   Smoking status: Never   Smokeless tobacco:  Never  Vaping Use   Vaping status: Never Used  Substance Use Topics   Alcohol use: Never   Drug use: Never   Allergies   Patient has no known allergies.  Review of Systems Review of Systems Pertinent findings revealed after performing a 14 point review of systems has been noted in the history of present illness.  Physical Exam Vital Signs BP 118/81 (BP Location: Left Arm)   Pulse 90   Temp 98.3 F (36.8 C) (Oral)   Resp 16   LMP 04/02/2023 (Exact Date)   SpO2 97%   Breastfeeding Yes   No data found.  Physical Exam Vitals and nursing note reviewed.  Constitutional:      General: She is not in acute distress.    Appearance: Normal appearance.  HENT:     Head: Normocephalic and atraumatic.  Eyes:     Pupils: Pupils are equal, round, and reactive to light.  Cardiovascular:     Rate and Rhythm: Normal rate and regular rhythm.  Pulmonary:     Effort: Pulmonary effort is normal.     Breath sounds: Normal breath sounds.  Abdominal:     General: Abdomen is flat. Bowel sounds are normal.     Palpations: Abdomen is soft.  Tenderness: There is abdominal tenderness (Mild with deep palpation) in the suprapubic area.  Musculoskeletal:        General: Normal range of motion.     Cervical back: Normal range of motion and neck supple.  Skin:    General: Skin is warm and dry.  Neurological:     General: No focal deficit present.     Mental Status: She is alert and oriented to person, place, and time. Mental status is at baseline.  Psychiatric:        Mood and Affect: Mood normal.        Behavior: Behavior normal.        Thought Content: Thought content normal.        Judgment: Judgment normal.     Visual Acuity Right Eye Distance:   Left Eye Distance:   Bilateral Distance:    Right Eye Near:   Left Eye Near:    Bilateral Near:     UC Couse / Diagnostics / Procedures:     Radiology No results found.  Procedures Procedures (including critical care  time) EKG  Pending results:  Labs Reviewed  POCT URINE PREGNANCY - Abnormal; Notable for the following components:      Result Value   Preg Test, Ur Positive (*)    All other components within normal limits  POCT URINALYSIS DIP (MANUAL ENTRY)    Medications Ordered in UC: Medications - No data to display  UC Diagnoses / Final Clinical Impressions(s)   I have reviewed the triage vital signs and the nursing notes.  Pertinent labs & imaging results that were available during my care of the patient were reviewed by me and considered in my medical decision making (see chart for details).    Final diagnoses:  Less than [redacted] weeks gestation of pregnancy   Patient advised of negative urine dip.  Patient advised of positive pregnancy test.  Patient further advised that bilateral suprapubic pain likely due to expansion of ligaments and the pelvis.  Patient advised to monitor for signs of bleeding, go to the MAU if these occur.  Patient also advised to reach out to her obstetrician to schedule an appointment for her first prenatal visit and to begin prenatal vitamins now.  Patient states she is currently taking no medications, discontinued Megace several months ago, per EMR, reviewed by me, this was prescribed for heavy bleeding secondary to uterine fibroids.  Patient is currently breast-feeding her 44-month-old son but only intermittently.  Patient has 3 children and states she is already aware that her milk supply will drive up as her pregnancy advances.  Please see discharge instructions below for details of plan of care as provided to patient. ED Prescriptions   None    PDMP not reviewed this encounter.  Pending results:  Labs Reviewed  POCT URINE PREGNANCY - Abnormal; Notable for the following components:      Result Value   Preg Test, Ur Positive (*)    All other components within normal limits  POCT URINALYSIS DIP (MANUAL ENTRY)      Discharge Instructions      ?????? ??????  ????? ??? ???? ???. ???????? ??? ??? ????? ???? ?????? ??? 22 ????? ???? ??? ??????? 4 ???? ??? ?????.  ????? ?? ??????? ???? ???? ? ?? ???? ??????? ??? ??? ?? ????? ?? ???????? ?? ???? ???? ???. ?? ??? 1 ??????? ???? ????.  ????? ?? ????? ?????? ??? ???? ?????? ?? ?? ???? ?????? ?? ??? ?? ?????? ?????? ???? ????? ??? ? ???? ????? ?????? ??? ?? ???? ??? ???? ?????? ????? ????.  ???? ?? ????? ????? ?? ?????? ???? ??? ??? ???? ?????.  Your pregnancy test today is positive.  Because the first day of your last menstrual period was December 22, you are almost [redacted] weeks along.  Please stop at the pharmacy and pick up a bottle of prenatal vitamins, any brand is sufficient.  Take 1 vitamin every day.  Please reach out to your obstetrician to let them know you had a positive pregnancy test and to schedule an appointment for your first prenatal visit.  Thank you for visiting [facility] today.    Disposition Upon Discharge:  Condition: stable for discharge home  Patient presented with an acute illness with associated systemic symptoms and significant discomfort requiring urgent management. In my opinion, this is a condition that a prudent lay person (someone who possesses an average knowledge of health and medicine) may potentially expect to result in complications if not addressed urgently such as respiratory distress, impairment of bodily function or dysfunction of bodily organs.   Routine symptom specific, illness specific and/or disease specific instructions were discussed with the patient and/or caregiver at length.   As such, the patient has been evaluated and assessed, work-up was performed and treatment was provided in alignment with urgent care protocols and evidence based medicine.  Patient/parent/caregiver has been advised that the patient may require follow up for further testing and treatment if the symptoms continue in spite of treatment, as clinically indicated and  appropriate.  Patient/parent/caregiver has been advised to return to the [facility] or PCP if no better; to PCP or the Emergency Department if new signs and symptoms develop, or if the current signs or symptoms continue to change or worsen for further workup, evaluation and treatment as clinically indicated and appropriate  The patient will follow up with their current PCP if and as advised. If the patient does not currently have a PCP we will assist them in obtaining one.   The patient may need specialty follow up if the symptoms continue, in spite of conservative treatment and management, for further workup, evaluation, consultation and treatment as clinically indicated and appropriate.  Patient/parent/caregiver verbalized understanding and agreement of plan as discussed.  All questions were addressed during visit.  Please see discharge instructions below for further details of plan.  This office note has been dictated using Teaching laboratory technician.  Unfortunately, this method of dictation can sometimes lead to typographical or grammatical errors.  I apologize for your inconvenience in advance if this occurs.  Please do not hesitate to reach out to me if clarification is needed.      Theadora Rama Scales, [place] 04/22/23 [phone]

## 2023-04-21 NOTE — Discharge Instructions (Signed)
?????? ?????? ????? ??? ???? ???. ???????? ??? ??? ????? ???? ?????? ???   22 ????? ???? ??? ??????? 4 ???? ??? ?????.  ????? ?? ??????? ???? ???? ? ?? ???? ??????? ??? ??? ?? ????? ?? ???????? ?? ???? ???? ???. ?? ??? 1 ??????? ???? ????.  ????? ?? ????? ?????? ??? ???? ?????? ?? ?? ???? ?????? ?? ??? ?? ?????? ?????? ???? ????? ??? ? ???? ????? ?????? ??? ?? ???? ??? ???? ?????? ????? ????.  ???? ?? ????? ????? ?? ?????? ???? ??? ??? ???? ?????.   Your pregnancy test today is positive.  Because the first day of your last menstrual period was December 22, you are almost [redacted] weeks along.  Please stop at the pharmacy and pick up a bottle of prenatal vitamins, any brand is sufficient.  Take 1 vitamin every day.  Please reach out to your obstetrician to let them know you had a positive pregnancy test and to schedule an appointment for your first prenatal visit.  Thank you for visiting [facility] today.

## 2023-04-21 NOTE — ED Triage Notes (Signed)
Patient here today with c/o lower abd pain that started today at 5 pm.

## 2023-04-29 ENCOUNTER — Encounter: Payer: Self-pay | Admitting: Student

## 2023-04-29 ENCOUNTER — Ambulatory Visit (INDEPENDENT_AMBULATORY_CARE_PROVIDER_SITE_OTHER): Payer: Medicaid Other | Admitting: Student

## 2023-04-29 VITALS — BP 99/57 | HR 107 | Ht 61.0 in | Wt 167.4 lb

## 2023-04-29 DIAGNOSIS — K5900 Constipation, unspecified: Secondary | ICD-10-CM

## 2023-04-29 DIAGNOSIS — Z3200 Encounter for pregnancy test, result unknown: Secondary | ICD-10-CM

## 2023-04-29 LAB — POCT URINE PREGNANCY: Preg Test, Ur: NEGATIVE

## 2023-04-29 NOTE — Progress Notes (Signed)
    SUBJECTIVE:   CHIEF COMPLAINT / HPI: No pain  33 year old female with history of subclinical hypothyroidism Presenting today for abdominal pain Recently seen in ED 2 weeks ago for abdominal pain POC urine pregnancy test at that time was positive However patient reports.  Started 6 days after ED visits. Prior to her recent positive pregnancy test has had normal.  They are irregular and LMP was on December 22. Today patient reports he is day 5 of menstrual period which is normal with her regular period.  In person interpreter used for entire encounter.  PERTINENT  PMH / PSH: Reviewed  OBJECTIVE:   BP (!) 99/57   Pulse (!) 107   Ht 5\' 1"  (1.549 m)   Wt 167 lb 6.4 oz (75.9 kg)   LMP 04/02/2023 (Exact Date)   SpO2 100%   BMI 31.63 kg/m    Physical Exam General: Alert, well appearing, NAD Cardiovascular: RRR, No Murmurs, Normal S2/S2 Respiratory: CTAB, No wheezing or Rales Abdomen: Soft, no distension (see note addendum from Dr. Manson Passey who completed abdominal exam as patient expressed desire for female provider for exam given religious believes) Extremities: No edema on extremities    ASSESSMENT/PLAN:   Abdominal pain Pregnancy test today was negative.  And given the patient's menses started about 5 days ago suspect prior positive test at the urgent care was most likely a false positive.  Also reassuring that patient's abdominal pain has significantly improved.  Of note patient reports she has had constipation for the past 2 months with hard stool and has reported inadequate fluid intake. She is drinking mostly tea and juice.  Hypotensive BP and tachycardic probably suggestive of inadequate fluid intake.   -Pregnancy test obtained -Ordered TSH due to concerns for constipation -Encourage adequate fluid intake -Recommend incorporating more vegetables and fruits to diet.  Tachycardia Asymptomatic, suspect patient could be slightly dehydrated given reports of inadequate fluid  intake as evidenced in her report of constipation and hypotensive BP. -Encourage adequate fluid intake -Ordered TSH   Jerre Simon, MD [facility] Medicine [facility]

## 2023-04-29 NOTE — Patient Instructions (Addendum)
??????? ?? ????? ?? ??? ?????? ?????.  ??? ?????? ????? ???? ???.  ????? ????? ?? ??? ??????? ??? ?? ?????? ???? ???????? ?? ???? ???? ???? ???.  ????? ????? ?? ??? ??? ??? ???? ?? ???? ?????? ??? ????? ???? ?? ???? ?? ??? ????? ??? ????.  ????? ?? ??? ??????? ??? ?? ?? ??????.  ?? ????? ????? ?? ????? ???? ?? ??? ?????   40-60 ???? ?? ??? ?? ???? ???????.  ?????? ??? ???????? ?? ??? ?? ???? ??? ?? ?????/????? ??? ??? ???? ????.    ???? ?? ? ??????? ?????? ?? ?? ???? ????? ??? ??? ????? ???.   Pleasure to meet you today.  Pregnancy test today was negative.  Suspect your hospital test at the urgent care was most likely a false positive.  Suspect your abdominal pain could be related to your period or it could be due to your constipation.  Today we will check your thyroid level.  I recommend making sure you are drinking enough water at least 40-60 ounces a day.  Also you can do prune juice to help with your constipation/hard stool.    Fruits and vegetables will help with loosening your stool as well.

## 2023-04-30 LAB — TSH: TSH: 0.566 u[IU]/mL (ref 0.450–4.500)

## 2023-05-04 ENCOUNTER — Encounter: Payer: Self-pay | Admitting: Student

## 2023-05-19 ENCOUNTER — Encounter (HOSPITAL_COMMUNITY): Payer: Self-pay

## 2023-05-19 ENCOUNTER — Ambulatory Visit (HOSPITAL_COMMUNITY)
Admission: EM | Admit: 2023-05-19 | Discharge: 2023-05-19 | Disposition: A | Discharge status: Home / Self Care | Payer: Medicaid Other | Attending: Emergency Medicine | Admitting: Emergency Medicine

## 2023-05-19 DIAGNOSIS — R051 Acute cough: Secondary | ICD-10-CM

## 2023-05-19 MED ORDER — BENZONATATE 100 MG PO CAPS: 100.0000 mg | ORAL_CAPSULE | Freq: Three times a day (TID) | ORAL | 0 refills | Status: DC | PRN

## 2023-05-19 NOTE — ED Triage Notes (Signed)
Patient here today with c/o cough, SOB sometimes, and fever X 2 weeks. Her son has also been sick with cough and runny nose for 3 weeks now. She has been taking Tylenol with no relief.

## 2023-05-19 NOTE — Discharge Instructions (Signed)
The tessalon cough pills can be taken 3x daily. If this medication makes you drowsy, take only one pill before bed. Cough should continue to improve over the next week. Contact primary care provider (family doctor) for a follow up visit  If cough worsens or you develop fever, please return ??? ??? ???? ?????? ????? ?? 3x ?? ??? ???? ????. ??? ??? ???? ??? ?? ???? ???? ??????? ??? ?? ???? ??? ?? ??? ???? ????. ???? ???? ?? ????? ???? ????? ????? ????. ???? ?? ?????? ?????? ?? ????? ????? ?????? ??? ????? (????? ???????) ???? ??????  ??? ???? ????? ?? ? ?? ?? ???? ?????? ????? ???????

## 2023-05-19 NOTE — ED Provider Notes (Signed)
[facility]    CSN: [id number] Arrival date & time: 05/19/23  1800      History   Chief Complaint Chief Complaint  Patient presents with   Cough    HPI Katrina Brewer is a 33 y.o. female.  Medical interpreter used for encounter History is somewhat unclear It seems like 1 or 2 week history of cough.  However patient reports she has been feeling much better and symptoms have slowly improved.  Cough is decreasing. Seems she is worried she still has cough, sometimes at night time.  She has not had any recent fever.  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 Authorizing Provider  benzonatate (TESSALON) 100 MG capsule Take 1 capsule (100 mg total) by mouth 3 (three) times daily as needed for cough. 05/19/23  Yes Penny Frisbie, Ray Church    Family History History reviewed. No pertinent family history.  Social History Social History    Tobacco Use   Smoking status: Never   Smokeless tobacco: Never  Vaping Use   Vaping status: Never Used  Substance Use Topics   Alcohol use: Never   Drug use: Never     Allergies   Patient has no known allergies.   Review of Systems Review of Systems Per HPI  Physical Exam Triage Vital Signs ED Triage Vitals [05/19/23 1900]  Encounter Vitals Group     BP 106/64     Systolic BP Percentile      Diastolic BP Percentile      Pulse Rate 89     Resp 16     Temp (!) 97.2 F (36.2 C)     Temp Source Oral     SpO2 98 %     Weight      Height      Head Circumference      Peak Flow      Pain Score 0     Pain Loc      Pain Education      Exclude from Growth Chart    No data found.  Updated Vital Signs BP 106/64 (BP Location: Left Arm)  Pulse 89   Temp (!) 97.2 F (36.2 C) (Oral)   Resp 16   LMP 05/01/2023 (Exact Date)   SpO2 98%   Breastfeeding Yes    Physical Exam Vitals and nursing note reviewed.  Constitutional:      General: She is not in acute distress.    Appearance: Normal appearance. She is not ill-appearing.  HENT:     Right Ear: Tympanic membrane and ear canal normal.     Left Ear: Tympanic membrane and ear canal normal.     Nose: No rhinorrhea.     Mouth/Throat:     Mouth: Mucous membranes are moist.     Pharynx: Oropharynx is clear.  Cardiovascular:     Rate and Rhythm: Normal rate and regular rhythm.     Pulses: Normal pulses.     Heart sounds: Normal heart sounds.  Pulmonary:     Effort: Pulmonary effort is normal. No respiratory distress.     Breath sounds: Normal breath sounds. No wheezing, rhonchi or rales.  Musculoskeletal:     Cervical back: Normal range of motion.  Neurological:     Mental Status: She is alert and oriented to person, place, and time.      UC Treatments / Results  Labs (all labs ordered are listed, but only abnormal results are displayed) Labs Reviewed - No data to display  EKG   Radiology No results  found.  Procedures Procedures (including critical care time)  Medications Ordered in UC Medications - No data to display  Initial Impression / Assessment and Plan / UC Course  I have reviewed the triage vital signs and the nursing notes.  Pertinent labs & imaging results that were available during my care of the patient were reviewed by me and considered in my medical decision making (see chart for details).  Afebrile, well appearing, sating 98% room air Clear lungs No red flags. She is reporting decreased cough but concerned she still has it. Try tessalon TID prn. Advised contact PCP for follow up. Discussed viral etiology and that cough can linger. However if cough increases or she has new fever I would like her to return. Patient verbalizes understanding  Final Clinical Impressions(s) / UC Diagnoses   Final diagnoses:  Acute cough     Discharge Instructions      The tessalon cough pills can be taken 3x daily. If this medication makes you drowsy, take only one pill before bed. Cough should continue to improve over the next week. Contact primary care provider (family doctor) for a follow up visit  If cough worsens or you develop fever, please return ??? ??? ???? ?????? ????? ?? 3x ?? ??? ???? ????. ??? ??? ???? ??? ?? ???? ???? ??????? ??? ?? ???? ??? ?? ??? ???? ????. ???? ???? ?? ????? ???? ????? ????? ????. ???? ?? ?????? ?????? ?? ????? ????? ?????? ??? ????? (????? ???????) ???? ??????  ??? ???? ????? ?? ? ?? ?? ???? ?????? ????? ???????    ED Prescriptions     Medication Sig Dispense Auth. Provider   benzonatate (TESSALON) 100 MG capsule Take 1 capsule (100 mg total) by mouth 3 (three) times daily as needed for cough. 30 capsule Cleburn Maiolo, Lurena Joiner, PA-C      PDMP not reviewed this encounter.   Marlow Baars, [place] 05/19/23 1610

## 2023-05-27 ENCOUNTER — Ambulatory Visit (INDEPENDENT_AMBULATORY_CARE_PROVIDER_SITE_OTHER): Payer: Medicaid Other | Admitting: Student

## 2023-05-27 VITALS — BP 119/79 | HR 94 | Ht 61.0 in | Wt 167.4 lb

## 2023-05-27 DIAGNOSIS — H6992 Unspecified Eustachian tube disorder, left ear: Secondary | ICD-10-CM

## 2023-05-27 DIAGNOSIS — R052 Subacute cough: Secondary | ICD-10-CM | POA: Diagnosis present

## 2023-05-27 NOTE — Patient Instructions (Signed)
It was great to see you! Thank you for allowing me to participate in your care!   I recommend that you always bring your medications to each appointment as this makes it easy to ensure we are on the correct medications and helps [place] not miss when refills are needed.  Our plans for today:  -I have sent Flonase to your pharmacy, please use 2 sprays twice a day for 7 days -If cough is not improved in 1 to 2 weeks, please return -Please get an appointment to be seen for painful nipple while breast-feeding  Take care and seek immediate care sooner if you develop any concerns. Please remember to show up 15 minutes before your scheduled appointment time!  Tiffany Kocher, DO [facility]

## 2023-05-27 NOTE — Progress Notes (Signed)
    SUBJECTIVE:   CHIEF COMPLAINT / HPI:   Cough Seen by urgent care on 05/19/2023 for a cough of 1-2 weeks.  Given that timeframe, patient's had a cough since approximately 1/30 and still having cough today (3 weeks).  2 weeks ago, she did have a sore throat.  No current systemic symptoms.  She has remained afebrile since her visit at the urgent care.  She has no prior history of respiratory disease in childhood or adulthood.  She does not smoke.  Intermittent phlegm production.  No difficulty breathing.  Does report ear fullness, no otalgia.  OBJECTIVE:   BP 119/79   Pulse 94   Ht 5\' 1"  (1.549 m)   Wt 167 lb 6 oz (75.9 kg)   LMP 04/25/2023 (Exact Date)   SpO2 100%   BMI 31.63 kg/m    General: NAD, pleasant HEENT: Normocephalic, atraumatic head. Normal external ear, canal bilaterally.  Small translucent fluid behind left TM. EOM intact and normal conjunctiva BL. Normal external nose. Throat not erythematous, no exudate, no deviation. Cardio: RRR, no MRG. Cap Refill <2s. Respiratory: CTAB, normal wob on RA GI: Abdomen is soft, not tender, not distended. BS present Skin: Warm and dry  ASSESSMENT/PLAN:   Assessment & Plan Subacute cough Well-appearing, benign exam. Suspect postviral cough-no evidence to suggest secondary infection. - Supportive care measures discussed - If cough does not improve over the next 2 weeks, would consider chest x-ray - Return precautions discussed Dysfunction of left eustachian tube Small fluid behind left TM, clear-not opaque, no erythema, normal cone of light-suspect eustachian tube dysfunction (may in part be contributing to cough).  Differential does include acute otitis media, however no otalgia. - Trial Flonase for eustachian tube dysfunction - If no improvement, consider antibiotics for possible AOM  Follow-up recommendations At the end of the visit, patient endorsed nipple pain 2/2 breast-feeding her child.  Reports cracked nipples.  Discussed  using Vaseline/soothies and reducing feeds as child is greater than 12 months old-and requested patient make a separate appointment to discuss further.  Tiffany Kocher, DO [facility]

## 2023-07-30 ENCOUNTER — Ambulatory Visit (HOSPITAL_COMMUNITY): Admission: EM | Admit: 2023-07-30 | Discharge: 2023-07-30 | Disposition: A | Discharge status: Home / Self Care

## 2023-07-30 ENCOUNTER — Encounter (HOSPITAL_COMMUNITY): Payer: Self-pay | Admitting: *Deleted

## 2023-07-30 ENCOUNTER — Other Ambulatory Visit: Payer: Self-pay

## 2023-07-30 DIAGNOSIS — J302 Other seasonal allergic rhinitis: Secondary | ICD-10-CM | POA: Diagnosis not present

## 2023-07-30 MED ORDER — FEXOFENADINE HCL 180 MG PO TABS: 180.0000 mg | ORAL_TABLET | Freq: Every day | ORAL | 1 refills | Status: AC

## 2023-07-30 MED ORDER — AZELASTINE HCL 0.1 % NA SOLN: 1.0000 | Freq: Two times a day (BID) | NASAL | 1 refills | Status: DC

## 2023-07-30 NOTE — ED Provider Notes (Signed)
 [facility] [place]  Note:  This document was prepared using Dragon voice recognition software and may include unintentional dictation errors.  MRN: [id number] DOB: 09/09/1990  Subjective:   Katrina Brewer is a 33 y.o. female presenting for nasal congestion and headache x 5 days.  Patient denies any known sick contacts.  States that she is taking ibuprofen  and Tylenol  at home but no other medications to treat symptoms.  No previous history of seasonal allergies.  Denies cough, body aches, fever, fatigue, sore throat.  No current facility-administered medications for this encounter.  Current Outpatient Medications:    azelastine (ASTELIN) 0.1 % nasal spray, Place 1 spray into both nostrils 2 (two) times daily. Use in each nostril as directed, Disp: 30 mL, Rfl: 1   fexofenadine (ALLEGRA) 180 MG tablet, Take 1 tablet (180 mg total) by mouth daily., Disp: 60 tablet, Rfl: 1   No Known Allergies  Past Medical History:  Diagnosis Date   Overweight    Refugee health examination      Past Surgical History:  Procedure Laterality Date   CESAREAN SECTION  10/2013    History reviewed. No pertinent family history.  Social History   Tobacco Use   Smoking status: Never   Smokeless tobacco: Never  Vaping Use   Vaping status: Never Used  Substance Use Topics   Alcohol use: Never   Drug use: Never    ROS Refer to HPI for ROS details.  Objective:   Vitals: BP 116/79   Pulse (!) 102   Temp 98.4 F (36.9 C)   Resp 16   LMP 07/30/2023   SpO2 98%   Breastfeeding Yes   Physical Exam Vitals and nursing note reviewed.  Constitutional:      General: She is not in acute distress.    Appearance: Normal appearance. She is well-developed. She is not ill-appearing or toxic-appearing.  HENT:     Head: Normocephalic and atraumatic.     Right Ear: Tympanic membrane, ear canal and external ear normal.     Left Ear: Tympanic membrane, ear canal and external ear normal.     Nose:  Congestion and rhinorrhea present.     Mouth/Throat:     Mouth: Mucous membranes are moist.     Pharynx: Oropharynx is clear.  Eyes:     General:        Right eye: No discharge.        Left eye: No discharge.     Extraocular Movements: Extraocular movements intact.     Conjunctiva/sclera: Conjunctivae normal.  Cardiovascular:     Rate and Rhythm: Normal rate.  Pulmonary:     Effort: Pulmonary effort is normal. No respiratory distress.  Skin:    General: Skin is warm and dry.  Neurological:     General: No focal deficit present.     Mental Status: She is alert and oriented to person, place, and time.  Psychiatric:        Mood and Affect: Mood normal.        Behavior: Behavior normal.     Procedures  No results found for this or any previous visit (from the past 24 hours).  No results found.   Assessment and Plan :     Discharge Instructions       1. Seasonal allergies (Primary) - fexofenadine (ALLEGRA) 180 MG tablet; Take 1 tablet (180 mg total) by mouth daily.  Dispense: 60 tablet; Refill: 1 - azelastine (ASTELIN) 0.1 % nasal spray; Place 1  spray into both nostrils 2 (two) times daily. Use in each nostril as directed  Dispense: 30 mL; Refill: 1 -Continue to monitor symptoms for any change in severity if there is any escalation of current symptoms or development of new symptoms follow-up in ER for further evaluation and management.      Stpehen Petitjean B Autry Prust   Danali Marinos, Nada B, [place] 07/30/23 [phone]

## 2023-07-30 NOTE — ED Triage Notes (Addendum)
 PT reports her nose is clogged up . Pt also has a HA and itching in her ears.

## 2023-07-30 NOTE — Discharge Instructions (Addendum)
 1. Seasonal allergies (Primary) - fexofenadine (ALLEGRA) 180 MG tablet; Take 1 tablet (180 mg total) by mouth daily.  Dispense: 60 tablet; Refill: 1 - azelastine (ASTELIN) 0.1 % nasal spray; Place 1 spray into both nostrils 2 (two) times daily. Use in each nostril as directed  Dispense: 30 mL; Refill: 1 -Continue to monitor symptoms for any change in severity if there is any escalation of current symptoms or development of new symptoms follow-up in ER for further evaluation and management.

## 2024-02-11 ENCOUNTER — Encounter (HOSPITAL_COMMUNITY): Payer: Self-pay

## 2024-02-11 ENCOUNTER — Ambulatory Visit (HOSPITAL_COMMUNITY)
Admission: EM | Admit: 2024-02-11 | Discharge: 2024-02-11 | Disposition: A | Discharge status: Home / Self Care | Attending: Family Medicine | Admitting: Family Medicine

## 2024-02-11 DIAGNOSIS — J029 Acute pharyngitis, unspecified: Secondary | ICD-10-CM

## 2024-02-11 DIAGNOSIS — J020 Streptococcal pharyngitis: Secondary | ICD-10-CM

## 2024-02-11 LAB — POCT RAPID STREP A (OFFICE): Rapid Strep A Screen: POSITIVE — AB

## 2024-02-11 MED ORDER — AMOXICILLIN 875 MG PO TABS: 875.0000 mg | ORAL_TABLET | Freq: Two times a day (BID) | ORAL | 0 refills | Status: AC

## 2024-02-11 MED ORDER — ACETAMINOPHEN 325 MG PO TABS
650.0000 mg | ORAL_TABLET | Freq: Once | ORAL | Status: AC
  Administered 2024-02-11: 650 mg via ORAL

## 2024-02-11 MED ORDER — ACETAMINOPHEN 325 MG PO TABS
ORAL_TABLET | ORAL | Status: AC
  Filled 2024-02-11: qty 2

## 2024-02-11 NOTE — ED Provider Notes (Signed)
 [facility]    CSN: [id number] Arrival date & time: 02/11/24  1021      History   Chief Complaint Chief Complaint  Patient presents with   Sore Throat    HPI Katrina Brewer is a 33 y.o. female.    Sore Throat  Interpreter used today.  Patient is here for sore throat, fever, body aches, and headache.  This has been going on x 3 days.   No known sick contacts.       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 Authorizing Provider  azelastine  (ASTELIN ) 0.1 % nasal spray Place 1 spray into both nostrils 2 (two) times daily. Use in each nostril as directed 07/30/23   Reddick, Johnathan B, NP  fexofenadine  (ALLEGRA ) 180 MG tablet Take 1 tablet (180 mg total) by mouth daily. Patient not taking: Reported on 02/11/2024 07/30/23   Aurea Ethel NOVAK, NP    Family History History reviewed. No pertinent family history.  Social History Social History   Tobacco Use   Smoking status: Never   Smokeless  tobacco: Never  Vaping Use   Vaping status: Never Used  Substance Use Topics   Alcohol use: Never   Drug use: Never     Allergies   Patient has no known allergies.   Review of Systems Review of Systems  Constitutional:  Positive for fever.  HENT:  Positive for sore throat.   Respiratory: Negative.    Cardiovascular: Negative.   Gastrointestinal: Negative.   Genitourinary: Negative.   Musculoskeletal: Negative.   Psychiatric/Behavioral: Negative.       Physical Exam Triage Vital Signs ED Triage Vitals  Encounter Vitals Group     BP 02/11/24 1035 111/73     Girls Systolic BP Percentile --      Girls Diastolic BP Percentile --      Boys Systolic BP Percentile --      Boys Diastolic BP Percentile --      Pulse Rate 02/11/24 1035 (!) 125     Resp 02/11/24 1035 16     Temp 02/11/24 1035 (!) 101.1 F (38.4 C)     Temp Source 02/11/24 1035 Oral  SpO2 02/11/24 1035 97 %     Weight --      Height --      Head Circumference --      Peak Flow --      Pain Score 02/11/24 1036 6     Pain Loc --      Pain Education --      Exclude from Growth Chart --    No data found.  Updated Vital Signs BP 111/73 (BP Location: Left Arm)   Pulse (!) 125   Temp (!) 101.1 F (38.4 C) (Oral)   Resp 16   LMP 02/04/2024 (Approximate)   SpO2 97%   Breastfeeding No   Visual Acuity Right Eye Distance:   Left Eye Distance:   Bilateral Distance:    Right Eye Near:   Left Eye Near:    Bilateral Near:     Physical Exam Constitutional:      General: She is not in acute distress.    Appearance: She is well-developed and normal weight. She is not ill-appearing or toxic-appearing.  HENT:     Nose: No congestion or rhinorrhea.     Mouth/Throat:     Mouth: Mucous membranes are moist.     Tonsils: Tonsillar exudate present. 2+ on the right. 2+ on the left.  Cardiovascular:     Rate and Rhythm: Normal rate and regular rhythm.     Heart sounds: Normal heart sounds.  Pulmonary:      Effort: Pulmonary effort is normal.  Musculoskeletal:     Cervical back: Normal range of motion and neck supple.  Lymphadenopathy:     Cervical: Cervical adenopathy present.  Neurological:     General: No focal deficit present.     Mental Status: She is alert.  Psychiatric:        Mood and Affect: Mood normal.      UC Treatments / Results  Labs (all labs ordered are listed, but only abnormal results are displayed) Labs Reviewed  POCT RAPID STREP A (OFFICE) - Abnormal; Notable for the following components:      Result Value   Rapid Strep A Screen Positive (*)    All other components within normal limits    EKG   Radiology No results found.  Procedures Procedures (including critical care time)  Medications Ordered in UC Medications  acetaminophen  (TYLENOL ) tablet 650 mg (has no administration in time range)    Initial Impression / Assessment and Plan / UC Course  I have reviewed the triage vital signs and the nursing notes.  Pertinent labs & imaging results that were available during my care of the patient were reviewed by me and considered in my medical decision making (see chart for details).  Final Clinical Impressions(s) / UC Diagnoses   Final diagnoses:  Sore throat  Strep pharyngitis     Discharge Instructions      ????? ??? ?? ??? ????? ????? ??? ???.  ?? ?? ???? ?????? ?????? ?? ???? ????? ??? ????? ??????? ??.   ??? ???? ?? ?? ??? ?? ????? ???? ???? ???????? ? ???? ????? ?? ????/???? ????.  ??? ???? ???? ??? ? ?? ? ?????? ????? ?? ??? ??? ?? ???????/?????? ??????? ????.  ??? ????? ?????? ????? ???????.  You were diagnosed with strep throat today.  I have sent out an oral antibiotic to treat this infection.   You should get a new toothbrush in two days, and clean/launder all bedding.  You may use tylenol /motrin  for pain  and fever, as well as warm salt water gargles.  Please return if not improving.     ED Prescriptions     Medication Sig  Dispense Auth. Provider   amoxicillin  (AMOXIL ) 875 MG tablet Take 1 tablet (875 mg total) by mouth 2 (two) times daily for 10 days. 20 tablet Darral Longs, MD      PDMP not reviewed this encounter.   Darral Longs, MD 02/11/24 1059

## 2024-02-11 NOTE — Discharge Instructions (Signed)
????? ??? ?? ??? ????? ????? ??? ???.  ?? ?? ???? ?????? ?????? ?? ???? ????? ??? ????? ??????? ??.   ??? ???? ?? ?? ??? ?? ????? ???? ???? ???????? ? ???? ????? ?? ????/???? ????.  ??? ???? ???? ??? ? ?? ? ?????? ????? ?? ??? ??? ?? ???????/?????? ??????? ????.  ??? ????? ?????? ????? ???????.    You were diagnosed with strep throat today.  I have sent out an oral antibiotic to treat this infection.   You should get a new toothbrush in two days, and clean/launder all bedding.  You may use tylenol /motrin  for pain and fever, as well as warm salt water gargles.  Please return if not improving.

## 2024-02-11 NOTE — ED Triage Notes (Signed)
 Patient here today with c/o ST, fever, headache, and sweats X 3 days. Patient has taken Tylenol , Ibuprofen , and Mucinex DM with no relief.

## 2024-03-17 ENCOUNTER — Other Ambulatory Visit: Payer: Self-pay

## 2024-03-17 ENCOUNTER — Ambulatory Visit (HOSPITAL_COMMUNITY)
Admission: EM | Admit: 2024-03-17 | Discharge: 2024-03-17 | Disposition: A | Discharge status: Home / Self Care | Attending: Emergency Medicine | Admitting: Emergency Medicine

## 2024-03-17 ENCOUNTER — Encounter (HOSPITAL_COMMUNITY): Payer: Self-pay | Admitting: Emergency Medicine

## 2024-03-17 DIAGNOSIS — Z3202 Encounter for pregnancy test, result negative: Secondary | ICD-10-CM

## 2024-03-17 DIAGNOSIS — R300 Dysuria: Secondary | ICD-10-CM | POA: Diagnosis not present

## 2024-03-17 DIAGNOSIS — R10A1 Flank pain, right side: Secondary | ICD-10-CM | POA: Diagnosis not present

## 2024-03-17 LAB — POCT URINALYSIS DIP (MANUAL ENTRY)
Bilirubin, UA: NEGATIVE
Glucose, UA: NEGATIVE mg/dL
Ketones, POC UA: NEGATIVE mg/dL
Leukocytes, UA: NEGATIVE
Nitrite, UA: NEGATIVE
Protein Ur, POC: NEGATIVE mg/dL
Spec Grav, UA: 1.01 (ref 1.010–1.025)
Urobilinogen, UA: 2 U/dL — AB
pH, UA: 7 (ref 5.0–8.0)

## 2024-03-17 LAB — POCT URINE PREGNANCY: Preg Test, Ur: NEGATIVE

## 2024-03-17 MED ORDER — IBUPROFEN 600 MG PO TABS: 600.0000 mg | ORAL_TABLET | Freq: Four times a day (QID) | ORAL | 0 refills | Status: AC | PRN

## 2024-03-17 NOTE — ED Triage Notes (Signed)
 Pain bilateral flank pain, right worse than left .  Onset 2 days ago with symptoms.  Slight burning with urination, and has vaginal itching  Lmp 01/28/2024

## 2024-03-17 NOTE — ED Provider Notes (Signed)
 [facility]    CSN: [id number] Arrival date & time: 03/17/24  1106      History   Chief Complaint Chief Complaint  Patient presents with   Flank Pain    HPI Katrina Brewer is a 33 y.o. female.   Patient presents with intermittent right sided flank pain and mild dysuria that began 2 days ago.  Patient describes pain as right sided pain that radiates to her right lower abdomen.  Denies any back pain.  Denies urinary frequency/urgency, hematuria, vaginal pain/irritation, vaginal itching, abnormal vaginal discharge/bleeding, nausea, vomiting, diarrhea, and fever.  Patient denies any previous history of abdominal surgeries other than a C-section.  LMP 11/1.  The history is provided by the patient and medical records. The history is limited by a language barrier. A language interpreter was used Dentist interpreter).  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 Authorizing Provider  ibuprofen  (ADVIL )  600 MG tablet Take 1 tablet (600 mg total) by mouth every 6 (six) hours as needed. 03/17/24  Yes Johnie, Catherine Cubero A, NP  fexofenadine  (ALLEGRA ) 180 MG tablet Take 1 tablet (180 mg total) by mouth daily. Patient not taking: Reported on 02/11/2024 07/30/23   Aurea Ethel NOVAK, NP    Family History History reviewed. No pertinent family history.  Social History Social History[1]   Allergies   Patient has no known allergies.   Review of Systems Review of Systems  Genitourinary:  Positive for flank pain.   Per HPI  Physical Exam Triage Vital Signs ED Triage Vitals  Encounter Vitals Group     BP 03/17/24 1237 124/83     Girls Systolic BP Percentile --      Girls Diastolic BP Percentile --      Boys Systolic BP Percentile --      Boys Diastolic BP Percentile --      Pulse Rate 03/17/24 1237 95     Resp 03/17/24 1237 20     Temp 03/17/24 1237 98.1 F (36.7 C)     Temp Source 03/17/24 1237 Oral     SpO2 03/17/24 1237 98 %     Weight --  Height --      Head Circumference --      Peak Flow --      Pain Score 03/17/24 1233 4     Pain Loc --      Pain Education --      Exclude from Growth Chart --    No data found.  Updated Vital Signs BP 124/83 (BP Location: Left Arm)   Pulse 95   Temp 98.1 F (36.7 C) (Oral)   Resp 20   LMP 01/28/2024   SpO2 98%   Visual Acuity Right Eye Distance:   Left Eye Distance:   Bilateral Distance:    Right Eye Near:   Left Eye Near:    Bilateral Near:     Physical Exam Vitals and nursing note reviewed.  Constitutional:      General: She is awake. She is not in acute distress.    Appearance: Normal appearance. She is well-developed and well-groomed. She is not ill-appearing.  Abdominal:     General: Abdomen is protuberant. Bowel sounds are normal. There is no distension. There are no signs of injury.     Palpations: Abdomen is soft.     Tenderness: There is abdominal tenderness. There is no right CVA tenderness, left CVA  tenderness, guarding or rebound. Negative signs include Murphy's sign, Rovsing's sign, McBurney's sign, psoas sign and obturator sign.      Comments: Mild tenderness noted to right lateral side of abdomen without guarding or rebound tenderness.  Skin:    General: Skin is warm and dry.  Neurological:     Mental Status: She is alert.  Psychiatric:        Behavior: Behavior is cooperative.      UC Treatments / Results  Labs (all labs ordered are listed, but only abnormal results are displayed) Labs Reviewed  POCT URINALYSIS DIP (MANUAL ENTRY) - Abnormal; Notable for the following components:      Result Value   Color, UA light yellow (*)    Blood, UA trace-intact (*)    Urobilinogen, UA 2.0 (*)    All other components within normal limits  URINE CULTURE  POCT URINE PREGNANCY    EKG   Radiology No results found.  Procedures Procedures (including critical care time)  Medications Ordered in UC Medications - No data to display  Initial Impression / Assessment and Plan / UC Course  I have reviewed the triage vital signs and the nursing notes.  Pertinent labs & imaging results that were available during my care of the patient were reviewed by me and considered in my medical decision making (see chart for details).     Patient is overall well-appearing.  Vitals are stable.  Urinalysis unremarkable, will send urine culture to confirm presence of urinary tract infection.  UPT negative.   Recommend increasing water intake over the next few days as this could be related to the possible passing of a kidney stone.  Prescribed ibuprofen  as needed for pain.  Recommended alternate with Tylenol  as needed.  Discussed follow-up, return, and strict ER precautions. Final Clinical Impressions(s) / UC Diagnoses   Final diagnoses:  Right flank pain  Dysuria     Discharge Instructions      ????? ????? ??? ?? ??? ??? ??? ????? ????? ?? ???? ????? ?? ?? ??? ????? ?? ???? ????? ??????  ??????? ????? ????? ?????? ????? ?????? ???. ?? ??? ????? ???? ?? ??? ?? ???? ??????? ? ???????? ??? ?????????? ? ??????? ?? ???? ???? ???? ??? ?????? ????. ??? ??? ??? ??? ?? ??? ?????? ?????? ???? ??? ?? ?????? ??????? ??? ?? ??? ?? ?? ?? ????? ?????? ????? ?? ????? ????? ???? ??? ?? ??? ???? ?????.  Your urinalysis did not reveal any signs of infection at this time, we will send a urine culture to confirm any presence of urinary tract infection. In the meantime make sure you are drinking lots of water and you can alternate between ibuprofen  and Tylenol  as needed for pain. If you develop worsening abdominal pain, large amounts of blood in the urine, excessive vomiting, or fever please seek immediate medical treatment in the emergency department.   ED Prescriptions     Medication Sig Dispense Auth. Provider   ibuprofen  (ADVIL ) 600 MG tablet Take 1 tablet (600 mg total) by mouth every 6 (six) hours as needed. 30 tablet Johnie Flaming A, NP      PDMP not reviewed this encounter.    [1]  Social History Tobacco Use   Smoking status: Never   Smokeless tobacco: Never  Vaping Use   Vaping status: Never Used  Substance Use Topics   Alcohol use: Never   Drug use: Never     Johnie Flaming LABOR, NP 03/17/24 1329

## 2024-03-17 NOTE — Discharge Instructions (Addendum)
????? ????? ??? ?? ??? ??? ??? ????? ????? ?? ???? ????? ?? ?? ??? ????? ?? ???? ????? ?????? ??????? ????? ????? ?????? ????? ?????? ???. °?? ??? ????? ???? ?? ??? ?? ???? ??????? ? ???????? ??? ?????????? ? ??????? ?? ???? ???? ???? ??? ?????? ????. °??? ??? ??? ??? ?? ??? ?????? ?????? ???? ??? ?? ?????? ??????? ??? ?? ??? ?? ?? ?? ????? ?????? ????? ?? ????? ????? ???? ??? ?? ??? ???? ?????. ° °  Your urinalysis did not reveal any signs of infection at this time, we will send a urine culture to confirm any presence of urinary tract infection. In the meantime make sure you are drinking lots of water and you can alternate between ibuprofen  and Tylenol  as needed for pain. If you develop worsening abdominal pain, large amounts of blood in the urine, excessive vomiting, or fever please seek immediate medical treatment in the emergency department.

## 2024-03-18 LAB — URINE CULTURE: Culture: NO GROWTH

## 2024-03-19 ENCOUNTER — Ambulatory Visit (HOSPITAL_COMMUNITY): Payer: Self-pay

## 2024-03-23 ENCOUNTER — Ambulatory Visit: Payer: Self-pay | Admitting: Family Medicine

## 2024-03-23 ENCOUNTER — Encounter: Payer: Self-pay | Admitting: Family Medicine

## 2024-03-23 VITALS — BP 128/84 | HR 96 | Ht 61.0 in | Wt 178.2 lb

## 2024-03-23 DIAGNOSIS — R1084 Generalized abdominal pain: Secondary | ICD-10-CM

## 2024-03-23 DIAGNOSIS — R1032 Left lower quadrant pain: Secondary | ICD-10-CM | POA: Diagnosis not present

## 2024-03-23 DIAGNOSIS — N926 Irregular menstruation, unspecified: Secondary | ICD-10-CM | POA: Diagnosis not present

## 2024-03-23 DIAGNOSIS — R319 Hematuria, unspecified: Secondary | ICD-10-CM

## 2024-03-23 LAB — POCT URINE DIPSTICK
Bilirubin, UA: NEGATIVE
Glucose, UA: NEGATIVE mg/dL
Ketones, POC UA: NEGATIVE mg/dL
Nitrite, UA: NEGATIVE
POC PROTEIN,UA: NEGATIVE
Spec Grav, UA: 1.015
Urobilinogen, UA: 0.2 U/dL
pH, UA: 5.5

## 2024-03-23 LAB — POCT UA - MICROSCOPIC ONLY

## 2024-03-23 LAB — POCT URINE PREGNANCY: Preg Test, Ur: NEGATIVE

## 2024-03-23 NOTE — Patient Instructions (Addendum)
?????? ??? ?? ????? ??? ?? ???? - ?? ??????? ???? ????? ° °?????? ?? ????? ???? ?????: ° °?? ?? ???????? ?? ??? ??? ????? ??????. ?? ?? ????? ???? ????? ?? ??? ???? ??????? ?? ??? ????? ????. °??? ????? ????????? ??? ???????? ????? ?? ??? ???? ????? ????.  ° °  Good to see you today - Thank you for coming in  Things we discussed today:  We are ordering a CT scan of your abdomen. We will call you to schedule once it is approved by insurance.  I will call you if your labwork is not normal.

## 2024-03-23 NOTE — Progress Notes (Signed)
" ° ° °  SUBJECTIVE:   CHIEF COMPLAINT / HPI:   Abdominal pain x2 weeks Left flank is worst No inciting factor Intermittent, no aggravating or alleviating factors Typically worse at night Ibuprofen  helps No dysuria, no hematuria, vaginal sx  LMP October  Hx c-section Reports some bloating after eating, states she felt similarly with H pylori in the past   PERTINENT  PMH / PSH: subclinical hyperthyroidism  OBJECTIVE:   BP 128/84   Pulse 96   Ht 5' 1 (1.549 m)   Wt 178 lb 3.2 oz (80.8 kg)   LMP 01/28/2024   SpO2 100%   BMI 33.67 kg/m   General: well appearing, NAD Respiratory: normal work of breathing on RA Abdomen: Normal bowel sounds, soft, non-distended. Generalized TTP, worse in LLQ and suprapubic region. No masses palpated.  Extremities: No swelling BLE  ASSESSMENT/PLAN:   Assessment & Plan Generalized abdominal pain Left lower quadrant abdominal pain Upreg negative. No UTI. Ddx include diverticulitis, kidney stone, ovarian pathology. Will order CT A/P, consider transvaginal US  if unremarkable.  Will test for H.pylori, pt reports similar sx in the past with H.pylori Abnormal menstruation Hematuria, unspecified type Patient has not had menstrual cycle in 2 months, had regular cycles prior.  Upreg negative. UA with trace blood. Will check CBC, BMP, TSH to evaluate for secondary causes.      Elyce Prescott, DO [facility] "

## 2024-03-24 LAB — TSH RFX ON ABNORMAL TO FREE T4: TSH: 0.609 u[IU]/mL (ref 0.450–4.500)

## 2024-03-25 LAB — CBC
Hematocrit: 36.1 % (ref 34.0–46.6)
Hemoglobin: 11.2 g/dL (ref 11.1–15.9)
MCH: 24.6 pg — ABNORMAL LOW (ref 26.6–33.0)
MCHC: 31 g/dL — ABNORMAL LOW (ref 31.5–35.7)
MCV: 79 fL (ref 79–97)
Platelets: 450 x10E3/uL (ref 150–450)
RBC: 4.55 x10E6/uL (ref 3.77–5.28)
RDW: 14.5 % (ref 11.7–15.4)
WBC: 9.1 x10E3/uL (ref 3.4–10.8)

## 2024-03-25 LAB — BASIC METABOLIC PANEL WITH GFR
BUN/Creatinine Ratio: 12 (ref 9–23)
BUN: 7 mg/dL (ref 6–20)
CO2: 25 mmol/L (ref 20–29)
Calcium: 9.8 mg/dL (ref 8.7–10.2)
Chloride: 100 mmol/L (ref 96–106)
Creatinine, Ser: 0.57 mg/dL (ref 0.57–1.00)
Glucose: 74 mg/dL (ref 70–99)
Potassium: 4.5 mmol/L (ref 3.5–5.2)
Sodium: 138 mmol/L (ref 134–144)
eGFR: 123 mL/min/1.73

## 2024-03-25 LAB — H. PYLORI BREATH TEST: H pylori Breath Test: POSITIVE — AB

## 2024-03-26 ENCOUNTER — Ambulatory Visit: Payer: Self-pay | Admitting: Family Medicine

## 2024-03-26 DIAGNOSIS — A048 Other specified bacterial intestinal infections: Secondary | ICD-10-CM

## 2024-03-26 MED ORDER — OMEPRAZOLE 20 MG PO CPDR: 20.0000 mg | DELAYED_RELEASE_CAPSULE | Freq: Two times a day (BID) | ORAL | 0 refills | Status: AC

## 2024-04-15 ENCOUNTER — Other Ambulatory Visit: Payer: Self-pay

## 2024-04-15 ENCOUNTER — Inpatient Hospital Stay (HOSPITAL_COMMUNITY)
Admission: AD | Admit: 2024-04-15 | Discharge: 2024-04-15 | Disposition: A | Discharge status: Home / Self Care | Attending: Obstetrics and Gynecology | Admitting: Obstetrics and Gynecology

## 2024-04-15 DIAGNOSIS — Z603 Acculturation difficulty: Secondary | ICD-10-CM | POA: Diagnosis not present

## 2024-04-15 DIAGNOSIS — Z789 Other specified health status: Secondary | ICD-10-CM

## 2024-04-15 DIAGNOSIS — N912 Amenorrhea, unspecified: Secondary | ICD-10-CM | POA: Diagnosis not present

## 2024-04-15 NOTE — MAU Provider Note (Signed)
"   None     S [name] is a 34 y.o. [id number] female at Unknown who presents to MAU today with complaint of amenorrhea x2 months. She reports she has not had a period since the beginning of November, but she had a negative home pregnancy test yesterday. She denies vaginal bleeding, abdominal pain.  Pertinent items noted in HPI and remainder of comprehensive ROS otherwise negative.   O BP 119/86 (BP Location: Right Arm)   Pulse 95   Temp 98 F (36.7 C) (Oral)   Resp 16   LMP 01/28/2024  Physical Exam Vitals reviewed.  Constitutional:      General: She is not in acute distress.    Appearance: She is well-developed. She is not diaphoretic.  Eyes:     General: No scleral icterus. Pulmonary:     Effort: Pulmonary effort is normal. No respiratory distress.  Skin:    General: Skin is warm and dry.  Neurological:     Mental Status: She is alert.     Coordination: Coordination normal.    No results found for this or any previous visit (from the past 24 hours).  MDM: Straightforward MAU Course: -Vital signs within normal limits.  -Discussed that outpatient workup is appropriate in the absence of an emergency. Patient verbalized understanding. A 1. Amenorrhea (Primary) - Discharge patient  2. Negative pregnancy test in prior 48 hours  3. Language barrier  Medical screening exam complete  P Discharge from MAU in stable condition with return precautions Discussed outpatient follow up with Family Medicine or OBGYN, patient would like to follow up with OBGYN  Future Appointments  Date Time Provider Department Center  04/23/2024 10:00 AM [facility]   Allergies as of 04/15/2024   No Known Allergies      Medication List     TAKE these medications    Bismuth/Metronidaz/Tetracyclin 140-125-125 MG Caps Commonly known as: Pylera Take 3 capsules by mouth 4 (four) times daily.   fexofenadine  180 MG tablet Commonly known as: ALLEGRA  Take 1 tablet  (180 mg total) by mouth daily.   ibuprofen  600 MG tablet Commonly known as: ADVIL  Take 1 tablet (600 mg total) by mouth every 6 (six) hours as needed.   omeprazole  20 MG capsule Commonly known as: PRILOSEC Take 1 capsule (20 mg total) by mouth 2 (two) times daily before a meal.        Katrina DELENA Sear, PA  "

## 2024-04-15 NOTE — MAU Note (Signed)
 Katrina Brewer is a 34 y.o. at Unknown here in MAU reporting: states she has not had a period in 2.5 months and wants to know what is happening. Took a HPT that was negative yesterday. Does not have any abdominal pain. States when she stands up she has back pain. States she does not have any other complaints.    LMP: Beginning of November  Pain score: 0 Vitals:   04/15/24 1403  BP: 119/86  Pulse: 95  Resp: 16  Temp: 98 F (36.7 C)     FHT:n/a Lab orders placed from triage:

## 2024-04-15 NOTE — Discharge Instructions (Addendum)
 You will get a phone call to schedule an office visit to evaluate your change in menses.

## 2024-04-16 ENCOUNTER — Telehealth: Payer: Self-pay | Admitting: Family Medicine

## 2024-04-16 NOTE — Telephone Encounter (Signed)
 Unable to contact patient about upcoming appt. Will mail letter with appointment reminder.

## 2024-04-23 ENCOUNTER — Other Ambulatory Visit: Payer: Self-pay

## 2024-04-23 DIAGNOSIS — A048 Other specified bacterial intestinal infections: Secondary | ICD-10-CM

## 2024-04-25 ENCOUNTER — Ambulatory Visit: Payer: Self-pay | Admitting: Family Medicine

## 2024-04-25 LAB — H. PYLORI BREATH TEST: H pylori Breath Test: NEGATIVE

## 2024-05-24 ENCOUNTER — Ambulatory Visit: Payer: Self-pay | Admitting: Obstetrics and Gynecology
